# Patient Record
Sex: Female | Born: 1985
Health system: Southern US, Community
[De-identification: ages and names within clinical notes are randomized; demographics above are authoritative.]

## PROBLEM LIST (undated history)

## (undated) DIAGNOSIS — IMO0001 Reserved for inherently not codable concepts without codable children: Secondary | ICD-10-CM

## (undated) DIAGNOSIS — L509 Urticaria, unspecified: Secondary | ICD-10-CM

## (undated) HISTORY — DX: Urticaria, unspecified: L50.9

---
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
## 2011-01-14 ENCOUNTER — Inpatient Hospital Stay (HOSPITAL_COMMUNITY)
Admission: AD | Admit: 2011-01-14 | Discharge: 2011-01-15 | Disposition: A | Discharge status: Home / Self Care | Payer: BC Managed Care – PPO | Source: Ambulatory Visit | Attending: Obstetrics and Gynecology | Admitting: Obstetrics and Gynecology

## 2011-01-14 DIAGNOSIS — O479 False labor, unspecified: Secondary | ICD-10-CM | POA: Insufficient documentation

## 2011-01-17 ENCOUNTER — Inpatient Hospital Stay (HOSPITAL_COMMUNITY)
Admission: AD | Admit: 2011-01-17 | Discharge: 2011-01-18 | Disposition: A | Discharge status: Home / Self Care | Payer: BC Managed Care – PPO | Source: Ambulatory Visit | Attending: Obstetrics and Gynecology | Admitting: Obstetrics and Gynecology

## 2011-01-17 DIAGNOSIS — O479 False labor, unspecified: Secondary | ICD-10-CM | POA: Insufficient documentation

## 2011-01-23 ENCOUNTER — Inpatient Hospital Stay (HOSPITAL_COMMUNITY)
Admission: RE | Admit: 2011-01-23 | Discharge: 2011-01-25 | DRG: 373 | Disposition: A | Discharge status: Home / Self Care | Payer: BC Managed Care – PPO | Source: Ambulatory Visit | Attending: Obstetrics and Gynecology | Admitting: Obstetrics and Gynecology

## 2011-01-23 LAB — ABO/RH: ABO/RH(D): O POS

## 2011-01-23 LAB — CBC
HCT: 37.4 % (ref 36.0–46.0)
MCH: 30.3 pg (ref 26.0–34.0)
WBC: 15.3 10*3/uL — ABNORMAL HIGH (ref 4.0–10.5)

## 2011-01-24 LAB — CBC
HCT: 36.7 % (ref 36.0–46.0)
Platelets: 262 10*3/uL (ref 150–400)
RBC: 4.02 MIL/uL (ref 3.87–5.11)
WBC: 20.5 10*3/uL — ABNORMAL HIGH (ref 4.0–10.5)

## 2011-01-25 NOTE — Discharge Summary (Signed)
  Becky Leonard, Becky Leonard                ACCOUNT NO.:  [id number]  MEDICAL RECORD NO.:  [id number]           PATIENT TYPE:  I  LOCATION:  9135                          FACILITY:  [facility]  PHYSICIAN:  Sherron Monday, MD        DATE OF BIRTH:  1986-03-02  DATE OF ADMISSION:  01/23/2011 DATE OF DISCHARGE:  01/25/2011                              DISCHARGE SUMMARY   ADMITTING DIAGNOSIS:  Intrauterine pregnancy at 39 weeks for induction of labor.  DISCHARGE DIAGNOSES: 1. Intrauterine pregnancy at 39 weeks for induction of labor. 2. Delivered via spontaneous vaginal delivery.  HISTORY AND PHYSICAL:  For the complete history and physical, please see the dictated note.  HOSPITAL COURSE:  In brief, this 25 year old, G1, P0 at 16 weeks with favorable cervix for induction of labor.  She was admitted on the January 23, 2011 underwent induction without complication.  AROM was performed for light meconium.  This was discussed with the patient.  She was progressed to complete-complete +2, push for approximately an hour to deliver a viable female infant at 94 with Apgars of 8 at 1 minute and 9 at 5 minutes and weight of 7 pounds 1 ounce.  Placenta expressed intact. Left labial laceration and first-degree perineal laceration with 3-0 Vicryl Rapide repaired in the typical fashion.  Her postpartum course was relatively uncomplicated.  She remained afebrile and vital signs stable.  Hemoglobin decreased from 12.5-12.0.  She was discharged home on postpartum day #2, was ambulating, voiding without difficulty and normal lochia.  Her pain was well controlled.  She was discharged home with routine discharge instructions.  Numbers to call with any questions or problems.  She voices understanding.  She was also send with prescriptions for Motrin, Vicodin, and prenatal vitamins.  DISCHARGE INFORMATION:  O+, rubella immune, breast-feeding were discussed.  Contraception and 6-week checkup.  Hemoglobin shot from  5- 12.6.     Sherron Monday, MD     JB/MEDQ  D:  01/25/2011  T:  01/25/2011  Job:  [id number]  Electronically Signed by Sherron Monday MD on 01/25/2011 04:28:36 PM

## 2011-01-25 NOTE — H&P (Signed)
  NAMEAKERA, SNOWBERGER                ACCOUNT NO.:  [id number]  MEDICAL RECORD NO.:  [id number]         PATIENT TYPE:  WINP  LOCATION:  165                           [facility]ILITY:  [facility]  PHYSICIAN:  Sherron Monday, MD        DATE OF BIRTH:  02-Jul-1986  DATE OF ADMISSION:  01/23/2011 DATE OF DISCHARGE:                             HISTORY & PHYSICAL   ADMISSION DIAGNOSIS:  Intrauterine pregnancy at 39 plus weeks.  PROCEDURE:  Planned induction of labor.  HISTORY OF PRESENT ILLNESS:  A 25 year old G1, P0 at 35 weeks with an [facility] of January 26, 2011 by first trimester ultrasound who presents with favorable cervix and desires induction of labor.  She states she has had good fetal movement.  No loss of fluid.  No vaginal bleeding and irregular contractions.  We discussed with the patient risks, benefits and alternatives of induction of labor and she voices understanding to all of this.  PAST MEDICAL HISTORY:  Significant for her polycystic ovaries.  PAST SURGICAL HISTORY:  Not significant.  PAST OB/GYN HISTORY:  She is a G1 P0.  No abnormal Pap smear or sexually transmitted diseases.  MEDICATIONS:  Include prenatal vitamins.  ALLERGIES:  No known drug allergies.  No latex allergy.  SOCIAL HISTORY:  Denies alcohol, tobacco or drug use.  She is married.  FAMILY HISTORY:  Significant for diabetes and hypertension.  LABORATORY DATA:  She is O+.  Antibody screen negative.  Hemoglobin 12.5.  Pap is within normal limits.  Rubella immune.  RPR nonreactive. Hepatitis B surface antigen negative.  HIV negative.  Gonorrhea negative.  Chlamydia negative.  Platelets 301,000.  A 19-week ultrasound revealed confirmed EDC of January 26, 2011 revealed normal anatomy, anterior placenta, surprisingly tender.  Glucola was 107.  Group B strep is negative.  PHYSICAL EXAMINATION:  VITAL SIGNS:  She is afebrile.  Vital signs stable. GENERAL:  No apparent distress. CARDIOVASCULAR:  Regular rate and  rhythm. LUNGS:  Clear to auscultation bilaterally. ABDOMEN:  Soft.  Fundus is nontender. EXTREMITIES:  Symmetric and nontender. VAGINAL:  Her cervix was 3 cm dilated, 70% effaced, and -2 station. Fetal heart tones were in the 150s and this was made measuring appropriate for gestational age.  ASSESSMENT AND PLAN:  A 25 year old G1, P0 at 51 plus week for induction of labor given favorable cervix and term status.  She understands risks, benefits and alternatives of induction.  We advised that an induction along with Pitocin and with artificial rupture of membranes.  She voices understanding.  She will receive an epidural.     Sherron Monday, MD     JB/MEDQ  D:  01/22/2011  T:  01/22/2011  Job:  [id number]  Electronically Signed by Sherron Monday MD on 01/25/2011 04:28:03 PM

## 2011-01-26 ENCOUNTER — Inpatient Hospital Stay (HOSPITAL_COMMUNITY): Admission: AD | Admit: 2011-01-26 | Payer: Self-pay | Source: Home / Self Care | Admitting: Obstetrics and Gynecology

## 2011-12-01 ENCOUNTER — Inpatient Hospital Stay (HOSPITAL_COMMUNITY)
Admission: AD | Admit: 2011-12-01 | Payer: BC Managed Care – PPO | Source: Ambulatory Visit | Admitting: Obstetrics and Gynecology

## 2012-04-18 ENCOUNTER — Encounter (HOSPITAL_COMMUNITY): Payer: Self-pay | Admitting: Emergency Medicine

## 2012-04-18 ENCOUNTER — Emergency Department (HOSPITAL_COMMUNITY)
Admission: EM | Admit: 2012-04-18 | Discharge: 2012-04-18 | Disposition: A | Discharge status: Home / Self Care | Payer: BC Managed Care – PPO | Attending: Emergency Medicine | Admitting: Emergency Medicine

## 2012-04-18 DIAGNOSIS — S39012A Strain of muscle, fascia and tendon of lower back, initial encounter: Secondary | ICD-10-CM

## 2012-04-18 DIAGNOSIS — S335XXA Sprain of ligaments of lumbar spine, initial encounter: Secondary | ICD-10-CM | POA: Insufficient documentation

## 2012-04-18 DIAGNOSIS — Y998 Other external cause status: Secondary | ICD-10-CM | POA: Insufficient documentation

## 2012-04-18 DIAGNOSIS — Y93I9 Activity, other involving external motion: Secondary | ICD-10-CM | POA: Insufficient documentation

## 2012-04-18 DIAGNOSIS — S139XXA Sprain of joints and ligaments of unspecified parts of neck, initial encounter: Secondary | ICD-10-CM

## 2012-04-18 MED ORDER — CYCLOBENZAPRINE HCL 10 MG PO TABS: 10.0000 mg | ORAL_TABLET | Freq: Two times a day (BID) | ORAL | Status: AC | PRN
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
## 2012-04-18 NOTE — ED Provider Notes (Signed)
Medical screening examination/treatment/procedure(s) were performed by non-physician practitioner and as supervising physician I was immediately available for consultation/collaboration.   Zarayah Lanting A Aryon Nham, MD 04/18/12 1618 
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
## 2012-05-16 LAB — OB RESULTS CONSOLE RPR
RPR: NONREACTIVE
RPR: NONREACTIVE
RPR: NONREACTIVE

## 2012-05-16 LAB — OB RESULTS CONSOLE ABO/RH

## 2012-05-16 LAB — OB RESULTS CONSOLE HEPATITIS B SURFACE ANTIGEN: Hepatitis B Surface Ag: NEGATIVE

## 2012-05-16 LAB — OB RESULTS CONSOLE ANTIBODY SCREEN: Antibody Screen: NEGATIVE

## 2012-10-30 NOTE — L&D Delivery Note (Signed)
Delivery Note At 3:18 AM a viable and healthy female was delivered via Vaginal, Spontaneous Delivery (Presentation: ; Occiput Anterior; LOT).  APGAR: 8, 9; weight P .   Placenta status: Intact, Spontaneous.  Cord: 3 vessels with the following complications: None.    Anesthesia: Epidural  Episiotomy: None Lacerations: None Suture Repair: N/A Est. Blood Loss (mL): 400  Mom to postpartum.  Baby to stay with mommy and daddy.  BOVARD,Brionna Romanek 11/28/2012, 3:40 AM  Br/O+/RI/ Sunnie Nielsen? D/w pt and FOB circumcision for female infant inc r/b/a, wish to proceed

## 2012-11-07 LAB — OB RESULTS CONSOLE GBS: GBS: NEGATIVE

## 2012-11-27 ENCOUNTER — Inpatient Hospital Stay (HOSPITAL_COMMUNITY)
Admission: AD | Admit: 2012-11-27 | Discharge: 2012-11-29 | DRG: 373 | Disposition: A | Discharge status: Home / Self Care | Payer: BC Managed Care – PPO | Source: Ambulatory Visit | Attending: Obstetrics and Gynecology | Admitting: Obstetrics and Gynecology

## 2012-11-27 ENCOUNTER — Encounter (HOSPITAL_COMMUNITY): Payer: Self-pay | Admitting: *Deleted

## 2012-11-27 DIAGNOSIS — IMO0001 Reserved for inherently not codable concepts without codable children: Secondary | ICD-10-CM

## 2012-11-27 HISTORY — DX: Reserved for inherently not codable concepts without codable children: IMO0001

## 2012-11-27 MED ORDER — ACETAMINOPHEN 325 MG PO TABS: 650.0000 mg | ORAL_TABLET | ORAL | Status: DC | PRN

## 2012-11-27 MED ORDER — PHENYLEPHRINE 40 MCG/ML (10ML) SYRINGE FOR IV PUSH (FOR BLOOD PRESSURE SUPPORT): 80.0000 ug | PREFILLED_SYRINGE | INTRAVENOUS | Status: DC | PRN

## 2012-11-27 MED ORDER — LACTATED RINGERS IV SOLN
INTRAVENOUS | Status: DC
  Administered 2012-11-28: 03:00:00 via INTRAVENOUS

## 2012-11-27 MED ORDER — ONDANSETRON HCL 4 MG/2ML IJ SOLN: 4.0000 mg | Freq: Four times a day (QID) | INTRAMUSCULAR | Status: DC | PRN

## 2012-11-27 MED ORDER — CITRIC ACID-SODIUM CITRATE 334-500 MG/5ML PO SOLN: 30.0000 mL | ORAL | Status: DC | PRN

## 2012-11-27 MED ORDER — LACTATED RINGERS IV SOLN
500.0000 mL | Freq: Once | INTRAVENOUS | Status: AC
  Administered 2012-11-27: 500 mL via INTRAVENOUS

## 2012-11-27 MED ORDER — PHENYLEPHRINE 40 MCG/ML (10ML) SYRINGE FOR IV PUSH (FOR BLOOD PRESSURE SUPPORT)
80.0000 ug | PREFILLED_SYRINGE | INTRAVENOUS | Status: DC | PRN
  Filled 2012-11-27: qty 5

## 2012-11-27 MED ORDER — EPHEDRINE 5 MG/ML INJ: 10.0000 mg | INTRAVENOUS | Status: DC | PRN

## 2012-11-27 MED ORDER — LACTATED RINGERS IV SOLN: 500.0000 mL | INTRAVENOUS | Status: DC | PRN

## 2012-11-27 MED ORDER — LIDOCAINE HCL (PF) 1 % IJ SOLN
30.0000 mL | INTRAMUSCULAR | Status: DC | PRN
  Filled 2012-11-27: qty 30

## 2012-11-27 MED ORDER — OXYTOCIN 40 UNITS IN LACTATED RINGERS INFUSION - SIMPLE MED
62.5000 mL/h | INTRAVENOUS | Status: DC
  Filled 2012-11-27: qty 1000

## 2012-11-27 MED ORDER — FLEET ENEMA 7-19 GM/118ML RE ENEM: 1.0000 | ENEMA | RECTAL | Status: DC | PRN

## 2012-11-27 MED ORDER — BUTORPHANOL TARTRATE 2 MG/ML IJ SOLN: 2.0000 mg | INTRAMUSCULAR | Status: DC | PRN

## 2012-11-27 MED ORDER — OXYCODONE-ACETAMINOPHEN 5-325 MG PO TABS: 1.0000 | ORAL_TABLET | ORAL | Status: DC | PRN

## 2012-11-27 MED ORDER — DIPHENHYDRAMINE HCL 50 MG/ML IJ SOLN: 12.5000 mg | INTRAMUSCULAR | Status: DC | PRN

## 2012-11-27 MED ORDER — IBUPROFEN 600 MG PO TABS
600.0000 mg | ORAL_TABLET | Freq: Four times a day (QID) | ORAL | Status: DC | PRN
  Administered 2012-11-28: 600 mg via ORAL
  Filled 2012-11-27: qty 1

## 2012-11-27 MED ORDER — FENTANYL 2.5 MCG/ML BUPIVACAINE 1/10 % EPIDURAL INFUSION (WH - ANES)
14.0000 mL/h | INTRAMUSCULAR | Status: DC
  Filled 2012-11-27: qty 125

## 2012-11-27 MED ORDER — EPHEDRINE 5 MG/ML INJ
10.0000 mg | INTRAVENOUS | Status: DC | PRN
  Filled 2012-11-27: qty 4

## 2012-11-27 MED ORDER — OXYTOCIN BOLUS FROM INFUSION: 500.0000 mL | INTRAVENOUS | Status: DC

## 2012-11-27 NOTE — MAU Note (Signed)
Pt states her contractions increased in intensity at 1730. Pt states she had a visit in the office at 900am pt states she was 3cm

## 2012-11-28 ENCOUNTER — Encounter (HOSPITAL_COMMUNITY): Payer: Self-pay

## 2012-11-28 ENCOUNTER — Inpatient Hospital Stay (HOSPITAL_COMMUNITY): Payer: BC Managed Care – PPO | Admitting: Anesthesiology

## 2012-11-28 ENCOUNTER — Encounter (HOSPITAL_COMMUNITY): Payer: Self-pay | Admitting: Anesthesiology

## 2012-11-28 DIAGNOSIS — IMO0001 Reserved for inherently not codable concepts without codable children: Secondary | ICD-10-CM

## 2012-11-28 HISTORY — DX: Reserved for inherently not codable concepts without codable children: IMO0001

## 2012-11-28 LAB — CBC
MCH: 31.3 pg (ref 26.0–34.0)
MCV: 91.8 fL (ref 78.0–100.0)
Platelets: 247 10*3/uL (ref 150–400)
RDW: 12.7 % (ref 11.5–15.5)

## 2012-11-28 LAB — SYPHILIS: RPR W/REFLEX TO RPR TITER AND TREPONEMAL ANTIBODIES, TRADITIONAL SCREENING AND DIAGNOSIS ALGORITHM: RPR Ser Ql: NONREACTIVE

## 2012-11-28 MED ORDER — SENNOSIDES-DOCUSATE SODIUM 8.6-50 MG PO TABS
2.0000 | ORAL_TABLET | Freq: Every day | ORAL | Status: DC
  Administered 2012-11-28: 2 via ORAL

## 2012-11-28 MED ORDER — BENZOCAINE-MENTHOL 20-0.5 % EX AERO: 1.0000 "application " | INHALATION_SPRAY | CUTANEOUS | Status: DC | PRN

## 2012-11-28 MED ORDER — DIPHENHYDRAMINE HCL 25 MG PO CAPS: 25.0000 mg | ORAL_CAPSULE | Freq: Four times a day (QID) | ORAL | Status: DC | PRN

## 2012-11-28 MED ORDER — IBUPROFEN 600 MG PO TABS
600.0000 mg | ORAL_TABLET | Freq: Four times a day (QID) | ORAL | Status: DC
  Administered 2012-11-28 – 2012-11-29 (×4): 600 mg via ORAL
  Filled 2012-11-28 (×4): qty 1

## 2012-11-28 MED ORDER — ONDANSETRON HCL 4 MG/2ML IJ SOLN: 4.0000 mg | INTRAMUSCULAR | Status: DC | PRN

## 2012-11-28 MED ORDER — WITCH HAZEL-GLYCERIN EX PADS: 1.0000 "application " | MEDICATED_PAD | CUTANEOUS | Status: DC | PRN

## 2012-11-28 MED ORDER — FENTANYL 2.5 MCG/ML BUPIVACAINE 1/10 % EPIDURAL INFUSION (WH - ANES)
INTRAMUSCULAR | Status: DC | PRN
  Administered 2012-11-28: 14 mL/h via EPIDURAL

## 2012-11-28 MED ORDER — ONDANSETRON HCL 4 MG PO TABS: 4.0000 mg | ORAL_TABLET | ORAL | Status: DC | PRN

## 2012-11-28 MED ORDER — ZOLPIDEM TARTRATE 5 MG PO TABS: 5.0000 mg | ORAL_TABLET | Freq: Every evening | ORAL | Status: DC | PRN

## 2012-11-28 MED ORDER — OXYCODONE-ACETAMINOPHEN 5-325 MG PO TABS: 1.0000 | ORAL_TABLET | ORAL | Status: DC | PRN

## 2012-11-28 MED ORDER — PRENATAL MULTIVITAMIN CH
1.0000 | ORAL_TABLET | Freq: Every day | ORAL | Status: DC
  Administered 2012-11-28 – 2012-11-29 (×2): 1 via ORAL
  Filled 2012-11-28 (×2): qty 1

## 2012-11-28 MED ORDER — DIBUCAINE 1 % RE OINT: 1.0000 "application " | TOPICAL_OINTMENT | RECTAL | Status: DC | PRN

## 2012-11-28 MED ORDER — LACTATED RINGERS IV SOLN: INTRAVENOUS | Status: DC

## 2012-11-28 MED ORDER — LIDOCAINE HCL (PF) 1 % IJ SOLN
INTRAMUSCULAR | Status: DC | PRN
  Administered 2012-11-28 (×2): 5 mL

## 2012-11-28 MED ORDER — LANOLIN HYDROUS EX OINT: TOPICAL_OINTMENT | CUTANEOUS | Status: DC | PRN

## 2012-11-28 MED ORDER — SIMETHICONE 80 MG PO CHEW: 80.0000 mg | CHEWABLE_TABLET | ORAL | Status: DC | PRN

## 2012-11-28 NOTE — Progress Notes (Signed)
Patient ID: Becky Leonard, female   DOB: 06-12-1986, 27 y.o.   MRN: [id number]  Pt comfortable with epidural, some pressure  AFVSS gen NAD FHTs 140-150's mod var toco q 2-8min  SVE 10/100/+2, per RN  Will start pushing, anticipate SVD

## 2012-11-28 NOTE — Progress Notes (Signed)
Post Partum Day 0 Subjective: no complaints, up ad lib, voiding, tolerating PO and nl lochia, pain controlled  Objective: Blood pressure 95/56, pulse 96, temperature 99.2 F (37.3 C), temperature source Oral, resp. rate 18, height 5\' 1"  (1.549 m), weight 72.576 kg (160 lb), last menstrual period 03/30/2012, SpO2 97.00%, unknown if currently breastfeeding.  Physical Exam:  General: alert and no distress Lochia: appropriate Uterine Fundus: firm   Basename 11/27/12 2350  HGB 12.6  HCT 37.0    Assessment/Plan: Plan for discharge tomorrow or Saturday, Breastfeeding and Lactation consult.  Doing well.  Routine care.     LOS: 1 day   BOVARD,Estera Ozier 11/28/2012, 7:06 AM

## 2012-11-28 NOTE — H&P (Signed)
Becky Leonard is a 27 y.o. female G2P1001 at 38+ in active labor.  +FM, no LOF, no VB, ctx increasing in intensity and frequency, uncomplicated PNC. Maternal Medical History:  Reason for admission: Reason for admission: contractions.  Contractions: Onset was 3-5 hours ago.   Frequency: regular.    Fetal activity: Perceived fetal activity is normal.      OB History    Grav Para Term Preterm Abortions TAB SAB Ect Mult Living   2 1 1       1     G1 3/12 SVD 7#1 female; G2 present; no abn pap, no STDs. PCOS Past Medical History  Diagnosis Date  . Active labor at term 11/28/2012   History reviewed. No pertinent past surgical history. Family History: family history includes Diabetes in her other and Hypertension in her father.Afib, blood clot, CAD Social History:  reports that she has never smoked. She has never used smokeless tobacco. She reports that she does not drink alcohol or use illicit drugs.married Meds PNV All NKDA   Prenatal Transfer Tool  Maternal Diabetes: No Genetic Screening: Declined Maternal Ultrasounds/Referrals: Normal Fetal Ultrasounds or other Referrals:  None Maternal Substance Abuse:  No Significant Maternal Medications:  None Significant Maternal Lab Results:  Lab values include: Group B Strep negative Other Comments:  None  Review of Systems  Constitutional: Negative.   HENT: Negative.   Eyes: Negative.   Respiratory: Negative.   Cardiovascular: Negative.   Gastrointestinal: Negative.   Genitourinary: Negative.   Musculoskeletal: Negative.   Skin: Negative.   Neurological: Negative.   Psychiatric/Behavioral: Negative.     Dilation: 7.5 Effacement (%): 80 Station: -2 Exam by:: B.Bethea RN Blood pressure 144/82, pulse 101, temperature 97.9 F (36.6 C), temperature source Oral, resp. rate 20, height 5\' 1"  (1.549 m), weight 72.576 kg (160 lb), last menstrual period 03/30/2012. Maternal Exam:  Uterine Assessment: Contraction strength is moderate.   Contraction frequency is regular.   Abdomen: Fundal height is appropriate for gestation.   Estimated fetal weight is 7#.   Fetal presentation: vertex  Introitus: Normal vulva. Normal vagina.  Cervix: Cervix evaluated by digital exam.     Physical Exam  Constitutional: She is oriented to person, place, and time. She appears well-developed and well-nourished.  HENT:  Head: Normocephalic and atraumatic.  Neck: Normal range of motion. Neck supple.  Cardiovascular: Normal rate and regular rhythm.   Respiratory: Effort normal and breath sounds normal. No respiratory distress.  GI: Soft. Bowel sounds are normal. There is no tenderness.  Musculoskeletal: Normal range of motion.  Neurological: She is alert and oriented to person, place, and time.  Skin: Skin is warm and dry.  Psychiatric: She has a normal mood and affect. Her behavior is normal.    Prenatal labs: ABO, Rh: O/--/-- (07/18 0000) Antibody: Negative (07/18 0000) Rubella: Immune (07/18 0000) RPR: Nonreactive, Nonreactive, Nonreactive (07/18 0000)  HBsAg: Negative (07/18 0000)  HIV: Non-reactive, Non-reactive, Non-reactive (07/18 0000)  GBS: Negative (01/09 0000)  Hgb 13.0/ Pap WNL/ Plt 278K/ GC neg/ Chl neg/ screeening declined/ glucola 113  Tdap/Flu 09/12/12  [place] EDC 2/9 cwd, first tri [place] nl ant, ant plac, female  Assessment/Plan: 26yo G2P1001 @ 38+ in active labor Epidural for comfort gbbs neg Expect SVD   BOVARD,Deliana Avalos 11/28/2012, 12:31 AM

## 2012-11-28 NOTE — Anesthesia Preprocedure Evaluation (Signed)
Anesthesia Evaluation  Patient identified by MRN, date of birth, ID band Patient awake    Reviewed: Allergy & Precautions, H&P , NPO status , Patient's Chart, lab work & pertinent test results  Airway Mallampati: II TM Distance: >3 FB Neck ROM: full    Dental No notable dental hx.    Pulmonary neg pulmonary ROS,  breath sounds clear to auscultation  Pulmonary exam normal       Cardiovascular Exercise Tolerance: Good negative cardio ROS  Rhythm:regular Rate:Normal     Neuro/Psych negative neurological ROS  negative psych ROS   GI/Hepatic negative GI ROS, Neg liver ROS,   Endo/Other  negative endocrine ROS  Renal/GU negative Renal ROS  negative genitourinary   Musculoskeletal   Abdominal   Peds  Hematology negative hematology ROS (+)   Anesthesia Other Findings   Reproductive/Obstetrics negative OB ROS (+) Pregnancy                           Anesthesia Physical Anesthesia Plan  ASA: II  Anesthesia Plan: Epidural   Post-op Pain Management:    Induction:   Airway Management Planned:   Additional Equipment:   Intra-op Plan:   Post-operative Plan:   Informed Consent: I have reviewed the patients History and Physical, chart, labs and discussed the procedure including the risks, benefits and alternatives for the proposed anesthesia with the patient or authorized representative who has indicated his/her understanding and acceptance.   Dental Advisory Given  Plan Discussed with:   Anesthesia Plan Comments:         Anesthesia Quick Evaluation

## 2012-11-28 NOTE — Anesthesia Procedure Notes (Signed)
Epidural Patient location during procedure: OB  Staffing Anesthesiologist: Dalton Molesworth Performed by: anesthesiologist   Preanesthetic Checklist Completed: patient identified, site marked, surgical consent, pre-op evaluation, timeout performed, IV checked, risks and benefits discussed and monitors and equipment checked  Epidural Patient position: sitting Prep: ChloraPrep Patient monitoring: heart rate, continuous pulse ox and blood pressure Approach: midline Injection technique: LOR saline  Needle:  Needle type: Tuohy  Needle gauge: 17 G Needle length: 9 cm and 9 Needle insertion depth: 6 cm Catheter type: closed end flexible Catheter size: 20 Guage Catheter at skin depth: 12 cm Test dose: negative  Assessment Events: blood not aspirated, injection not painful, no injection resistance, negative IV test and no paresthesia  Additional Notes   Patient tolerated the insertion well without complications.   

## 2012-11-28 NOTE — Progress Notes (Signed)
Patient ID: Becky Leonard, female   DOB: Nov 10, 1985, 27 y.o.   MRN: [id number]  Comfortable with epidural  AFVSS gen NAD FHTs 150's, mod var toco q 2-4  SVE 9.5/100/0-+1  AROM for clear fluid w/o diff/comp  26yo G2P1001 at 38+ in active labor, anticipate SVD,

## 2012-11-29 LAB — CBC
HCT: 34.6 % — ABNORMAL LOW (ref 36.0–46.0)
Hemoglobin: 11.6 g/dL — ABNORMAL LOW (ref 12.0–15.0)
MCH: 31.4 pg (ref 26.0–34.0)
MCHC: 33.5 g/dL (ref 30.0–36.0)
MCV: 93.8 fL (ref 78.0–100.0)
Platelets: 208 K/uL (ref 150–400)
RBC: 3.69 MIL/uL — ABNORMAL LOW (ref 3.87–5.11)
RDW: 12.9 % (ref 11.5–15.5)
WBC: 15.2 K/uL — ABNORMAL HIGH (ref 4.0–10.5)

## 2012-11-29 MED ORDER — OXYCODONE-ACETAMINOPHEN 5-325 MG PO TABS: 1.0000 | ORAL_TABLET | ORAL | Status: DC | PRN

## 2012-11-29 MED ORDER — PRENATAL MULTIVITAMIN CH: 1.0000 | ORAL_TABLET | Freq: Every day | ORAL | Status: DC

## 2012-11-29 MED ORDER — IBUPROFEN 800 MG PO TABS: 800.0000 mg | ORAL_TABLET | Freq: Three times a day (TID) | ORAL | Status: DC | PRN

## 2012-11-29 NOTE — Progress Notes (Addendum)
Post Partum Day 1 Subjective: no complaints, up ad lib, voiding, tolerating PO and nl lochia, pain controlled  Objective: Blood pressure 103/67, pulse 61, temperature 97.9 F (36.6 C), temperature source Oral, resp. rate 18, height 5\' 1"  (1.549 m), weight 72.576 kg (160 lb), last menstrual period 03/30/2012, SpO2 95.00%, unknown if currently breastfeeding.  Physical Exam:  General: alert and no distress Lochia: appropriate Uterine Fundus: firm    Basename 11/29/12 0505 11/27/12 2350  HGB 11.6* 12.6  HCT 34.6* 37.0    Assessment/Plan: Discharge home today or tomorrow, Breastfeeding and Lactation consult.  Routine care.  Pt desires d/c today if possible.  D/C with motrin, percocet, pnv; f/u 6 weeks   LOS: 2 days   BOVARD,Kila Godina 11/29/2012, 8:33 AM

## 2012-11-29 NOTE — Anesthesia Postprocedure Evaluation (Signed)
Anesthesia Post Note  Patient: Becky Leonard  Procedure(s) Performed: * No procedures listed *  Anesthesia type: Epidural  Patient location: Mother/Baby  Post pain: Pain level controlled  Post assessment: Post-op Vital signs reviewed  Last Vitals:  Filed Vitals:   11/29/12 0532  BP: 103/67  Pulse: 61  Temp: 36.6 C  Resp: 18    Post vital signs: Reviewed  Level of consciousness:alert  Complications: No apparent anesthesia complications

## 2012-11-29 NOTE — Discharge Summary (Signed)
Obstetric Discharge Summary Reason for Admission: onset of labor Prenatal Procedures: none Intrapartum Procedures: spontaneous vaginal delivery Postpartum Procedures: none Complications-Operative and Postpartum: none Hemoglobin  Date Value Range Status  11/29/2012 11.6* 12.0 - 15.0 g/dL Final     HCT  Date Value Range Status  11/29/2012 34.6* 36.0 - 46.0 % Final    Physical Exam:  General: alert and no distress Lochia: appropriate Uterine Fundus: firm  Discharge Diagnoses: Term Pregnancy-delivered  Discharge Information: Date: 11/29/2012 Activity: pelvic rest Diet: routine Medications: PNV, Ibuprofen and Percocet Condition: stable Instructions: refer to practice specific booklet Discharge to: home Follow-up Information    Follow up with BOVARD,Sirenia Whitis, MD. Schedule an appointment as soon as possible for a visit in 6 weeks.   Contact information:   [address] [place] [address] [phone]          Newborn Data: Live born female  Birth Weight: 7 lb 13.8 oz (3566 g) APGAR: 8, 9  Home with mother.  BOVARD,Marciano Mundt 11/29/2012, 8:55 AM

## 2013-09-03 ENCOUNTER — Encounter (HOSPITAL_COMMUNITY): Payer: Self-pay | Admitting: Emergency Medicine

## 2013-09-03 ENCOUNTER — Emergency Department (HOSPITAL_COMMUNITY): Payer: BC Managed Care – PPO

## 2013-09-03 ENCOUNTER — Emergency Department (HOSPITAL_COMMUNITY)
Admission: EM | Admit: 2013-09-03 | Discharge: 2013-09-03 | Disposition: A | Discharge status: Home / Self Care | Payer: BC Managed Care – PPO | Attending: Emergency Medicine | Admitting: Emergency Medicine

## 2013-09-03 DIAGNOSIS — R109 Unspecified abdominal pain: Secondary | ICD-10-CM

## 2013-09-03 DIAGNOSIS — R110 Nausea: Secondary | ICD-10-CM | POA: Insufficient documentation

## 2013-09-03 DIAGNOSIS — Z3202 Encounter for pregnancy test, result negative: Secondary | ICD-10-CM | POA: Insufficient documentation

## 2013-09-03 LAB — URINALYSIS, ROUTINE W REFLEX MICROSCOPIC
Bilirubin Urine: NEGATIVE
Nitrite: NEGATIVE
Specific Gravity, Urine: 1.019 (ref 1.005–1.030)
pH: 6 (ref 5.0–8.0)

## 2013-09-03 LAB — CBC WITH DIFFERENTIAL/PLATELET
Basophils Absolute: 0.1 10*3/uL (ref 0.0–0.1)
Basophils Relative: 1 % (ref 0–1)
Eosinophils Absolute: 0.1 10*3/uL (ref 0.0–0.7)
MCH: 31.9 pg (ref 26.0–34.0)
MCHC: 34.5 g/dL (ref 30.0–36.0)
Neutrophils Relative %: 66 % (ref 43–77)
Platelets: 296 10*3/uL (ref 150–400)
RBC: 4.29 MIL/uL (ref 3.87–5.11)
RDW: 12.2 % (ref 11.5–15.5)

## 2013-09-03 LAB — URINE MICROSCOPIC-ADD ON

## 2013-09-03 LAB — COMPREHENSIVE METABOLIC PANEL
ALT: 9 U/L (ref 0–35)
Albumin: 4.9 g/dL (ref 3.5–5.2)
Alkaline Phosphatase: 52 U/L (ref 39–117)
Potassium: 3.7 mEq/L (ref 3.5–5.1)
Sodium: 138 mEq/L (ref 135–145)
Total Protein: 8.4 g/dL — ABNORMAL HIGH (ref 6.0–8.3)

## 2013-09-03 LAB — POCT PREGNANCY, URINE: Preg Test, Ur: NEGATIVE

## 2013-09-03 NOTE — ED Notes (Signed)
Pt c/o nausea since yesterday and intermittent right side pain that started today. Denies vomiting and diarrhea. Nad, skin warm and dry, resp e/u.

## 2013-09-03 NOTE — ED Provider Notes (Signed)
CSN: [id number]     Arrival date & time 09/03/13  1428 History   First MD Initiated Contact with Patient 09/03/13 1632     Chief Complaint  Patient presents with  . Abdominal Pain   (Consider location/radiation/quality/duration/timing/severity/associated sxs/prior Treatment) HPI Comments: Patient presents emergency department with chief complaint of abdominal pain. She states the pain started this morning. Started his left lower quadrant pain, and then moved to the right lower quadrant. She states that now it is moving to the right upper quadrant. She endorses associated nausea. She denies fevers, chills or, vomiting, diarrhea, constipation, dysuria, or vaginal discharge. She reports moderate to severe pain. The pain is worsened when she is up and moving.  The history is provided by the patient. No language interpreter was used.    Past Medical History  Diagnosis Date  . Active labor at term 11/28/2012  . SVD (spontaneous vaginal delivery) 11/28/2012   History reviewed. No pertinent past surgical history. Family History  Problem Relation Age of Onset  . Hypertension Father   . Diabetes Other    History  Substance Use Topics  . Smoking status: Never Smoker   . Smokeless tobacco: Never Used  . Alcohol Use: No   OB History   Grav Para Term Preterm Abortions TAB SAB Ect Mult Living   2 2 2       2      Review of Systems  All other systems reviewed and are negative.    Allergies  Review of patient's allergies indicates no known allergies.  Home Medications   Current Outpatient Rx  Name  Route  Sig  Dispense  Refill  . ibuprofen (ADVIL,MOTRIN) 800 MG tablet   Oral   Take 1 tablet (800 mg total) by mouth every 8 (eight) hours as needed for pain.   45 tablet   1   . oxyCODONE-acetaminophen (PERCOCET/ROXICET) 5-325 MG per tablet   Oral   Take 1-2 tablets by mouth every 4 (four) hours as needed (moderate - severe pain).   15 tablet   0   . Prenatal Vit-Fe Fumarate-FA  (PRENATAL MULTIVITAMIN) TABS   Oral   Take 1 tablet by mouth daily.         . Prenatal Vit-Fe Fumarate-FA (PRENATAL MULTIVITAMIN) TABS   Oral   Take 1 tablet by mouth daily.   30 tablet   12    BP 141/80  Pulse 108  Temp(Src) 98.3 F (36.8 C)  Resp 18  Ht 5\' 1"  (1.549 m)  Wt 119 lb (53.978 kg)  BMI 22.50 kg/m2  SpO2 99% Physical Exam  Nursing note and vitals reviewed. Constitutional: She is oriented to person, place, and time. She appears well-developed and well-nourished.  HENT:  Head: Normocephalic and atraumatic.  Eyes: Conjunctivae and EOM are normal. Pupils are equal, round, and reactive to light.  Neck: Normal range of motion. Neck supple.  Cardiovascular: Normal rate and regular rhythm.  Exam reveals no gallop and no friction rub.   No murmur heard. Pulmonary/Chest: Effort normal and breath sounds normal. No respiratory distress. She has no wheezes. She has no rales. She exhibits no tenderness.  Abdominal: Soft. Bowel sounds are normal. She exhibits no distension and no mass. There is no tenderness. There is no rebound and no guarding.  No focal abdominal tenderness, no rebounding, no pain at McBurney's point, no fluid wave, or signs of peritonitis  Musculoskeletal: Normal range of motion. She exhibits no edema and no tenderness.  Neurological: She is  alert and oriented to person, place, and time.  Skin: Skin is warm and dry.  Psychiatric: She has a normal mood and affect. Her behavior is normal. Judgment and thought content normal.    ED Course  Procedures (including critical care time) Results for orders placed during the hospital encounter of 09/03/13  CBC WITH DIFFERENTIAL      Result Value Range   WBC 8.8  4.0 - 10.5 K/uL   RBC 4.29  3.87 - 5.11 MIL/uL   Hemoglobin 13.7  12.0 - 15.0 g/dL   HCT 09.6  04.5 - 40.9 %   MCV 92.5  78.0 - 100.0 fL   MCH 31.9  26.0 - 34.0 pg   MCHC 34.5  30.0 - 36.0 g/dL   RDW 81.1  91.4 - 78.2 %   Platelets 296  150 - 400  K/uL   Neutrophils Relative % 66  43 - 77 %   Neutro Abs 5.8  1.7 - 7.7 K/uL   Lymphocytes Relative 26  12 - 46 %   Lymphs Abs 2.3  0.7 - 4.0 K/uL   Monocytes Relative 7  3 - 12 %   Monocytes Absolute 0.6  0.1 - 1.0 K/uL   Eosinophils Relative 1  0 - 5 %   Eosinophils Absolute 0.1  0.0 - 0.7 K/uL   Basophils Relative 1  0 - 1 %   Basophils Absolute 0.1  0.0 - 0.1 K/uL  COMPREHENSIVE METABOLIC PANEL      Result Value Range   Sodium 138  135 - 145 mEq/L   Potassium 3.7  3.5 - 5.1 mEq/L   Chloride 100  96 - 112 mEq/L   CO2 24  19 - 32 mEq/L   Glucose, Bld 79  70 - 99 mg/dL   BUN 11  6 - 23 mg/dL   Creatinine, Ser 9.56  0.50 - 1.10 mg/dL   Calcium 9.7  8.4 - 21.3 mg/dL   Total Protein 8.4 (*) 6.0 - 8.3 g/dL   Albumin 4.9  3.5 - 5.2 g/dL   AST 16  0 - 37 U/L   ALT 9  0 - 35 U/L   Alkaline Phosphatase 52  39 - 117 U/L   Total Bilirubin 0.5  0.3 - 1.2 mg/dL   GFR calc non Af Amer >90  >90 mL/min   GFR calc Af Amer >90  >90 mL/min  LIPASE, BLOOD      Result Value Range   Lipase 32  11 - 59 U/L  URINALYSIS, ROUTINE W REFLEX MICROSCOPIC      Result Value Range   Color, Urine YELLOW  YELLOW   APPearance CLEAR  CLEAR   Specific Gravity, Urine 1.019  1.005 - 1.030   pH 6.0  5.0 - 8.0   Glucose, UA NEGATIVE  NEGATIVE mg/dL   Hgb urine dipstick SMALL (*) NEGATIVE   Bilirubin Urine NEGATIVE  NEGATIVE   Ketones, ur 15 (*) NEGATIVE mg/dL   Protein, ur NEGATIVE  NEGATIVE mg/dL   Urobilinogen, UA 0.2  0.0 - 1.0 mg/dL   Nitrite NEGATIVE  NEGATIVE   Leukocytes, UA SMALL (*) NEGATIVE  URINE MICROSCOPIC-ADD ON      Result Value Range   Squamous Epithelial / LPF RARE  RARE   WBC, UA 0-2  <3 WBC/hpf   RBC / HPF 0-2  <3 RBC/hpf   Bacteria, UA MANY (*) RARE   Urine-Other MUCOUS PRESENT    POCT PREGNANCY, URINE  Result Value Range   Preg Test, Ur NEGATIVE  NEGATIVE   US Abdomen Complete  09/03/2013   CLINICAL DATA:  Abdominal pain  EXAM: ULTRASOUND ABDOMEN COMPLETE  COMPARISON:   None.  FINDINGS: Gallbladder  No gallstones or wall thickening visualized. No sonographic Murphy sign noted.  Common bile duct  Diameter: 2.9 mm.  Liver  No focal lesion identified. Within normal limits in parenchymal echogenicity.  IVC  No abnormality visualized.  Pancreas  Visualized portion unremarkable.  Spleen  Size and appearance within normal limits.  Right Kidney  Length: 12.2 cm. Echogenicity within normal limits. No mass or hydronephrosis visualized.  Left Kidney  Length: 12.1 cm. Echogenicity within normal limits. No mass or hydronephrosis visualized.  Abdominal aorta  No aneurysm visualized.  IMPRESSION: No acute finding by ultrasound. Negative exam.   Electronically Signed   By: Ruel Favors M.D.   On: 09/03/2013 18:23     EKG Interpretation   None       MDM   1. Abdominal  pain, other specified site     Patient with abdominal pain. The pain is moving. She endorses associated nausea. No fevers. No vomiting. Labs are reassuring.  UA shows some HGB will order abdominal/renal US.  Will re-evaluate.    Patient does not want any pain medicine at this time because she is nursing.  7:34 PM Ultrasound is normal. Other labs are reassuring. No white count, no fever. Abdomen is benign. The pain is nonfocal, and is moving. No tenderness with abdominal exam. Watch and wait at this time. Will discharge to home. Return precautions given. Patient will return if the pain localizes, or if she runs a fever, or have vomiting. Patient discussed with Dr. Effie Shy, who agrees with plan.    Roxy Horseman, PA-C 09/03/13 1935  Roxy Horseman, PA-C 09/03/13 [phone]

## 2013-09-03 NOTE — ED Notes (Signed)
Per pt right side pain that started this am. sts started as LLQ pain and radiated to RLQ and now is in right side area. sts 2 days of nausea.

## 2013-09-04 NOTE — ED Provider Notes (Signed)
Medical screening examination/treatment/procedure(s) were performed by non-physician practitioner and as supervising physician I was immediately available for consultation/collaboration.  Salimatou Simone L Aroldo Galli, MD 09/04/13 0023 

## 2014-01-08 ENCOUNTER — Other Ambulatory Visit: Payer: Self-pay | Admitting: Physician Assistant

## 2014-01-08 ENCOUNTER — Other Ambulatory Visit (HOSPITAL_COMMUNITY)
Admission: RE | Admit: 2014-01-08 | Discharge: 2014-01-08 | Disposition: A | Discharge status: Home / Self Care | Payer: BC Managed Care – PPO | Source: Ambulatory Visit | Attending: Physician Assistant | Admitting: Physician Assistant

## 2014-01-08 DIAGNOSIS — Z1151 Encounter for screening for human papillomavirus (HPV): Secondary | ICD-10-CM | POA: Insufficient documentation

## 2014-01-08 DIAGNOSIS — R87810 Cervical high risk human papillomavirus (HPV) DNA test positive: Secondary | ICD-10-CM | POA: Insufficient documentation

## 2014-01-08 DIAGNOSIS — Z124 Encounter for screening for malignant neoplasm of cervix: Secondary | ICD-10-CM | POA: Insufficient documentation

## 2014-02-05 ENCOUNTER — Other Ambulatory Visit: Payer: Self-pay | Admitting: Obstetrics & Gynecology

## 2014-03-05 ENCOUNTER — Other Ambulatory Visit: Payer: Self-pay | Admitting: Obstetrics & Gynecology

## 2014-08-31 ENCOUNTER — Encounter (HOSPITAL_COMMUNITY): Payer: Self-pay | Admitting: Emergency Medicine

## 2014-12-07 IMAGING — US US ABDOMEN COMPLETE
1 series · 14 of 25 positions shown · non-contrast
Comparison: None.

CLINICAL DATA: Abdominal pain

EXAM:
ULTRASOUND ABDOMEN COMPLETE

[Series 1: us abdomen complete · 0.21mm/px · 14 of 47 slices shown]
[im 1/47]
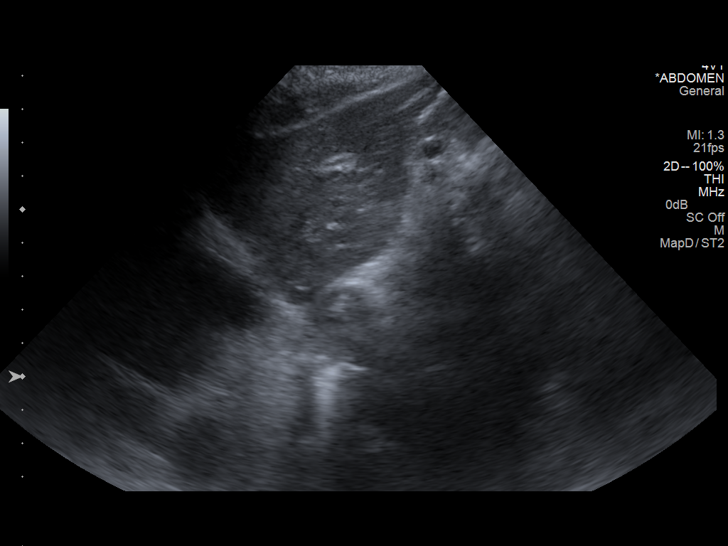
[im 4/47]
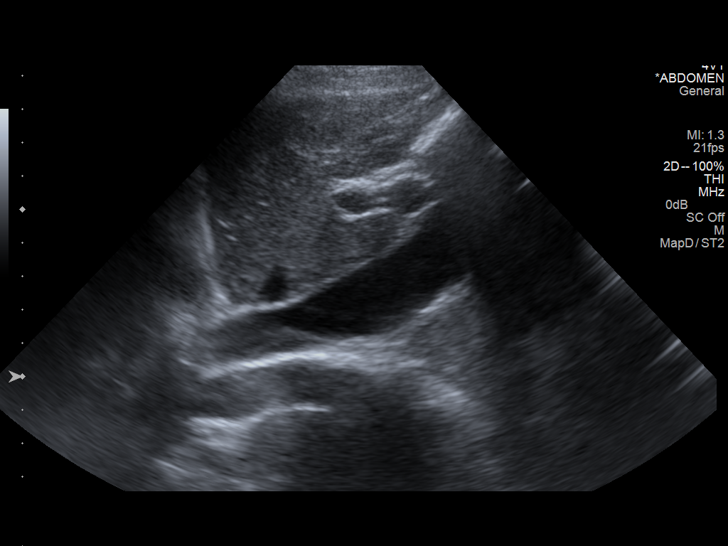
[im 8/47]
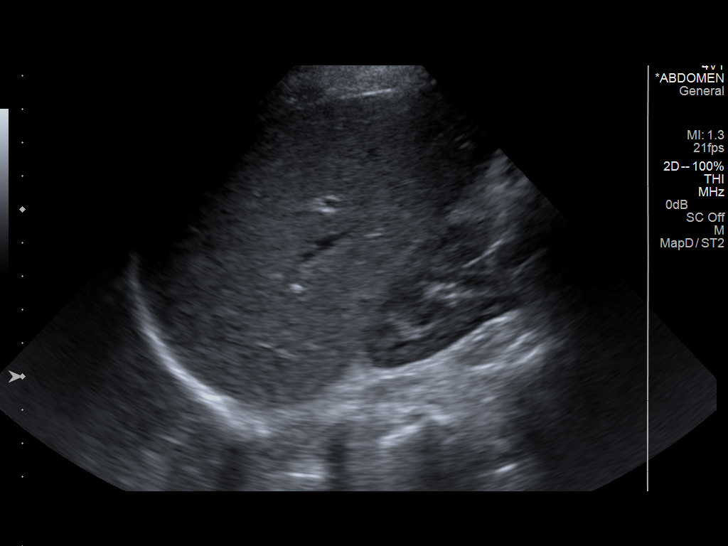
[im 12/47]
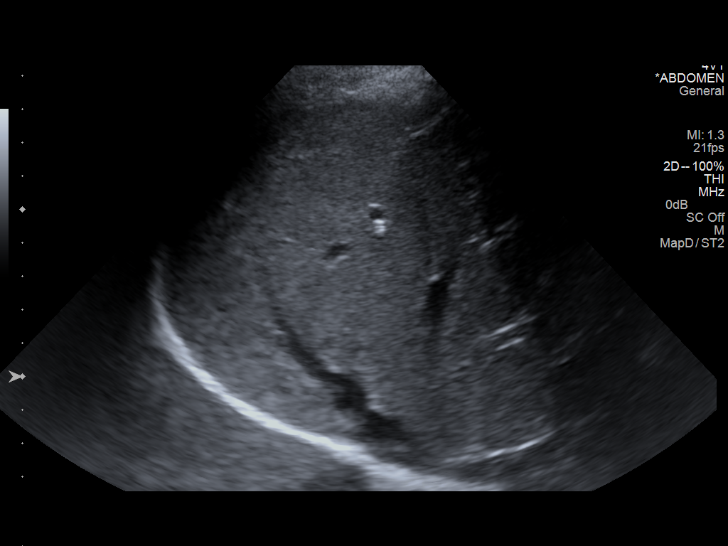
[im 16/47]
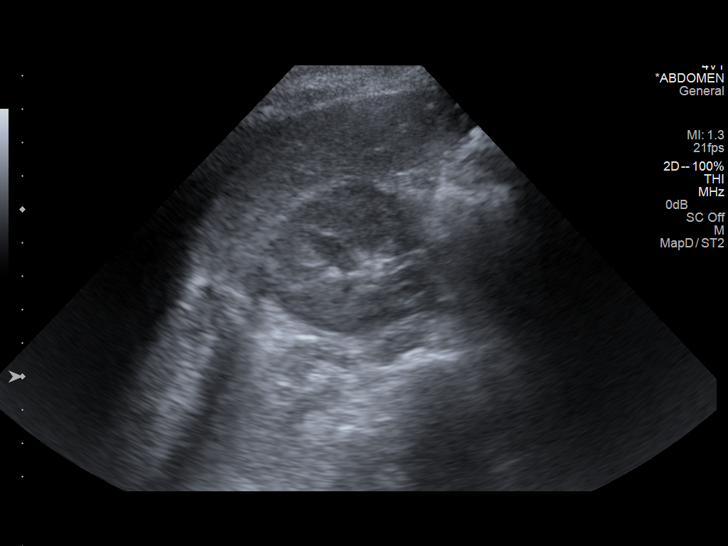
[im 18/47]
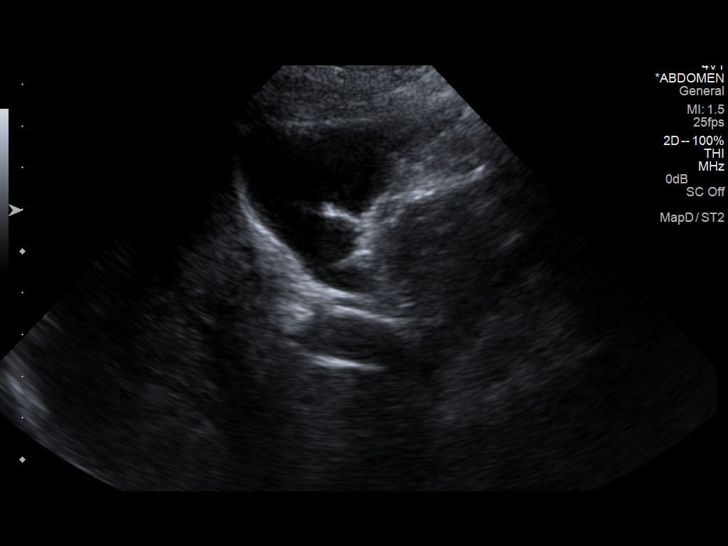
[im 22/47]
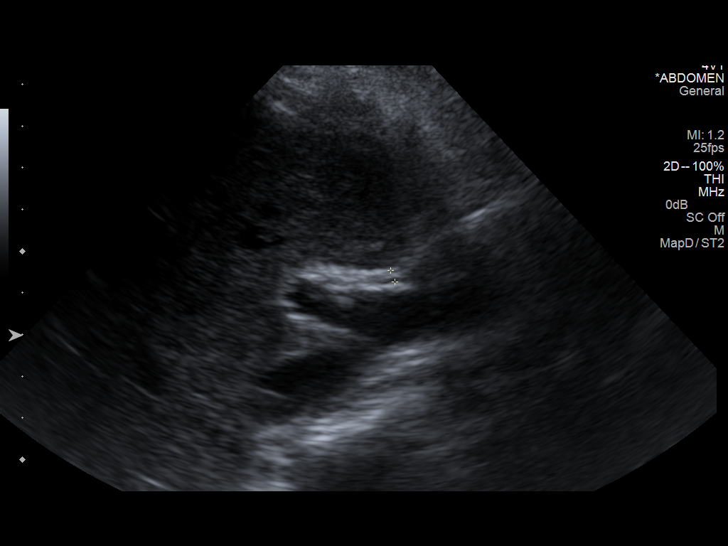
[im 25/47]
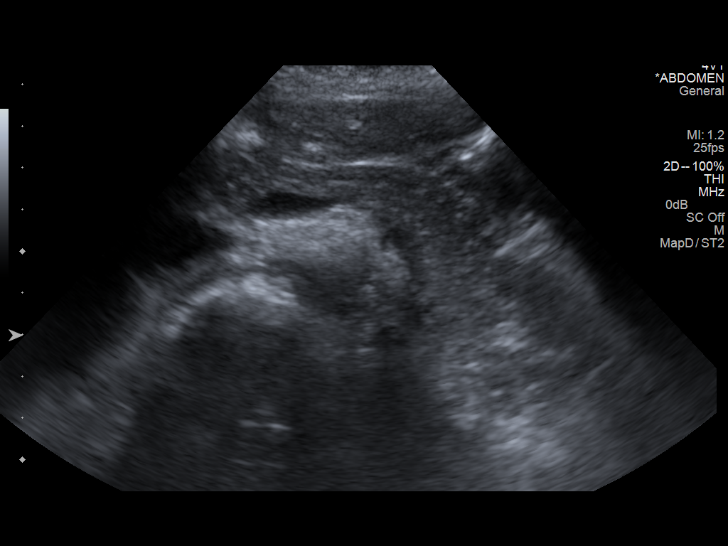
[im 29/47]
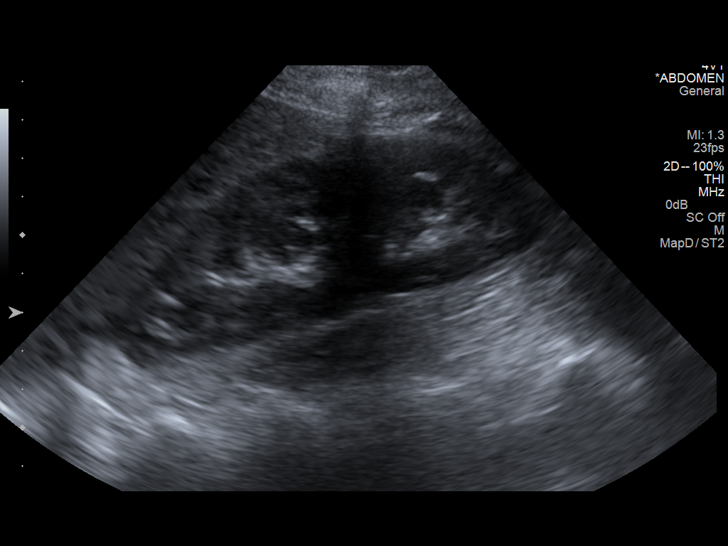
[im 31/47]
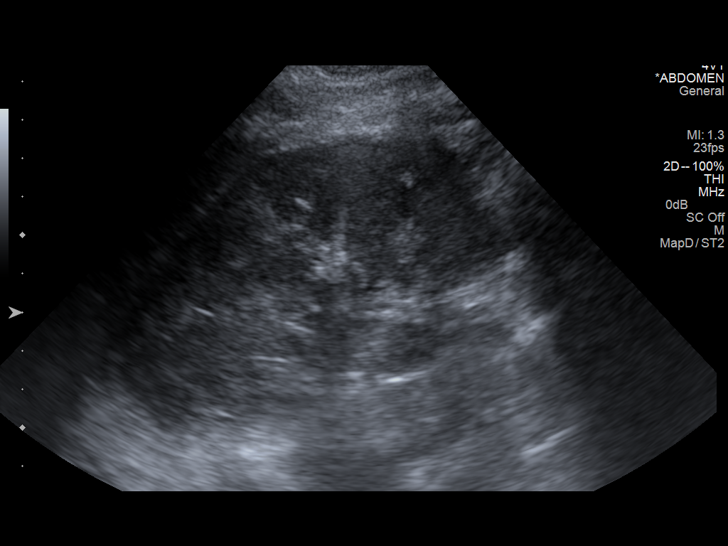
[im 35/47]
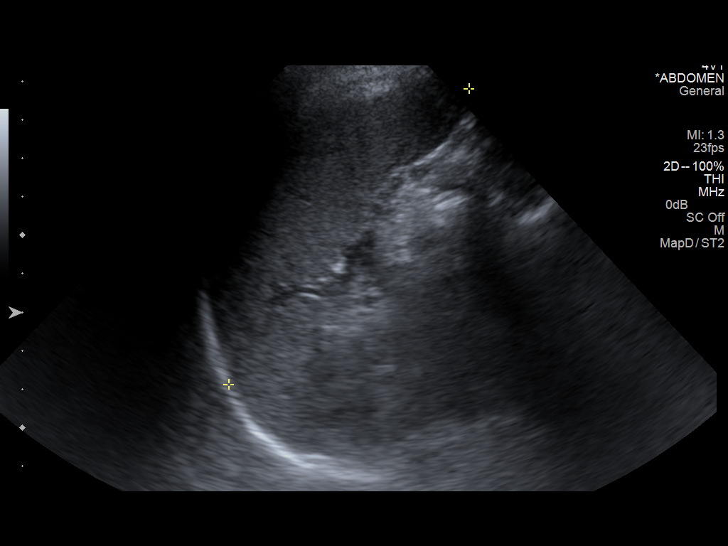
[im 39/47]
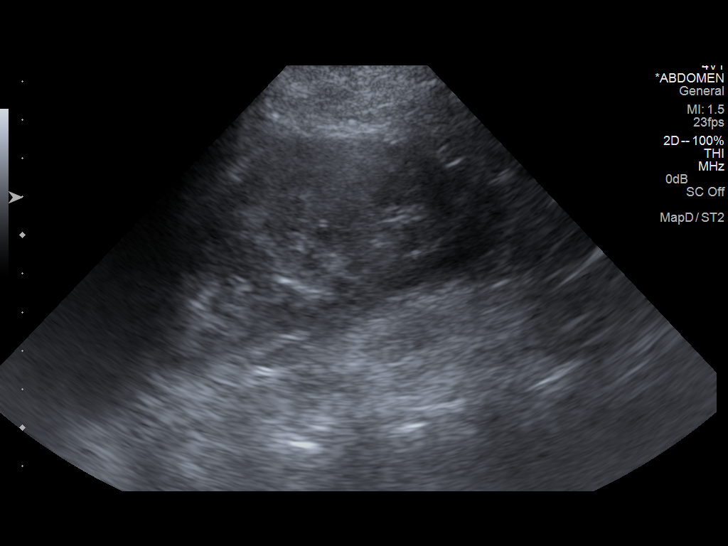
[im 43/47]
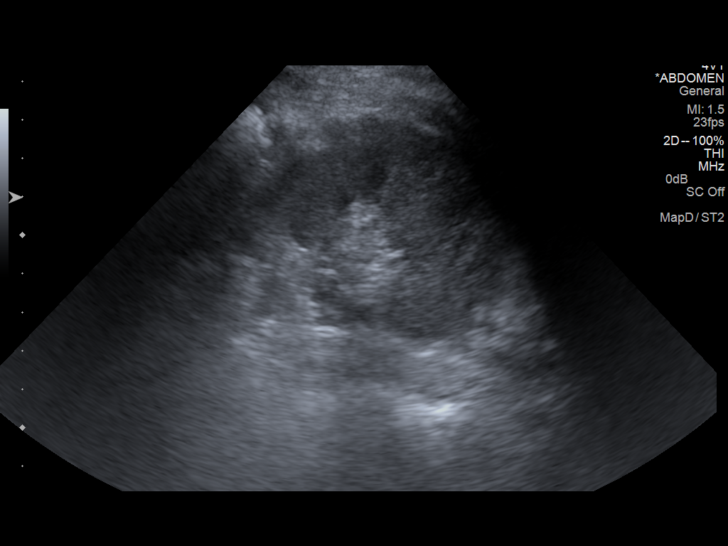
[im 47/47]
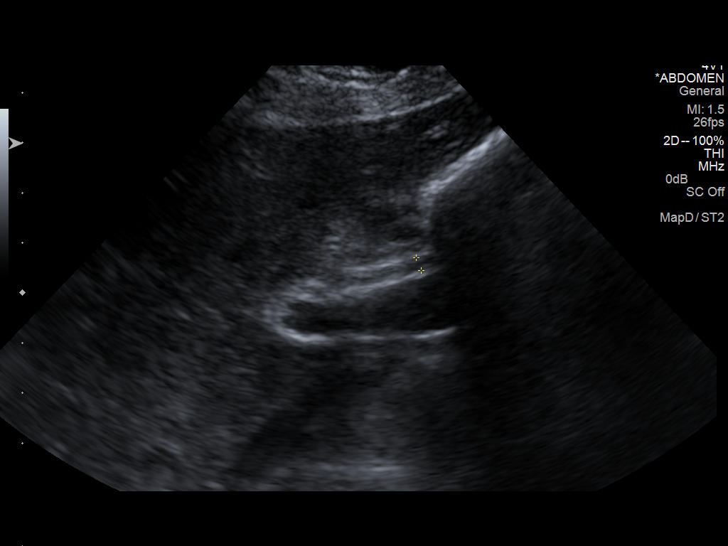

[14 of 25 positions shown; findings below may reference images not displayed]

FINDINGS: Gallbladder

No gallstones or wall thickening visualized. No sonographic Murphy
sign noted.

Common bile duct

Diameter: 2.9 mm.

Liver

No focal lesion identified. Within normal limits in parenchymal
echogenicity.

IVC

No abnormality visualized.

Pancreas

Visualized portion unremarkable.

Spleen

Size and appearance within normal limits.

Right Kidney

Length: 12.2 cm. Echogenicity within normal limits. No mass or
hydronephrosis visualized.

Left Kidney

Length: 12.1 cm. Echogenicity within normal limits. No mass or
hydronephrosis visualized.

Abdominal aorta

No aneurysm visualized.
IMPRESSION: No acute finding by ultrasound. Negative exam.

## 2015-01-14 ENCOUNTER — Other Ambulatory Visit: Payer: Self-pay | Admitting: Physician Assistant

## 2015-01-18 LAB — CYTOLOGY - PAP

## 2016-01-20 ENCOUNTER — Other Ambulatory Visit: Payer: Self-pay | Admitting: Physician Assistant

## 2016-01-20 ENCOUNTER — Other Ambulatory Visit (HOSPITAL_COMMUNITY)
Admission: RE | Admit: 2016-01-20 | Discharge: 2016-01-20 | Disposition: A | Discharge status: Home / Self Care | Payer: BLUE CROSS/BLUE SHIELD | Source: Ambulatory Visit | Attending: Family Medicine | Admitting: Family Medicine

## 2016-01-20 DIAGNOSIS — Z124 Encounter for screening for malignant neoplasm of cervix: Secondary | ICD-10-CM | POA: Diagnosis not present

## 2016-01-24 LAB — CYTOLOGY - PAP

## 2016-10-02 ENCOUNTER — Encounter (INDEPENDENT_AMBULATORY_CARE_PROVIDER_SITE_OTHER): Payer: Self-pay | Admitting: Orthopaedic Surgery

## 2016-10-02 ENCOUNTER — Ambulatory Visit (INDEPENDENT_AMBULATORY_CARE_PROVIDER_SITE_OTHER): Payer: BLUE CROSS/BLUE SHIELD | Admitting: Orthopaedic Surgery

## 2016-10-02 ENCOUNTER — Ambulatory Visit (INDEPENDENT_AMBULATORY_CARE_PROVIDER_SITE_OTHER): Payer: Self-pay

## 2016-10-02 DIAGNOSIS — S62644A Nondisplaced fracture of proximal phalanx of right ring finger, initial encounter for closed fracture: Secondary | ICD-10-CM | POA: Diagnosis not present

## 2016-10-02 NOTE — Progress Notes (Signed)
Office Visit Note   Patient: Becky Leonard           Date of Birth: [birth year]           MRN: [id number] Visit Date: 10/02/2016              Requested by: No referring provider defined for this encounter. PCP: No PCP Per Patient   Assessment & Plan: Visit Diagnoses:  1. Nondisplaced fracture of proximal phalanx of right ring finger, initial encounter for closed fracture     Plan: Buddy tape to the long finger was applied today begin gentle range of motion as tolerated. Follow up in 3 weeks for clinical recheck. X-rays not needed. We'll evaluate the need for hand therapy at that time.  Follow-Up Instructions: Return in about 3 years (around 10/03/2019) for recheck finger.   Orders:  Orders Placed This Encounter  Procedures  . XR Hand Complete Left   No orders of the defined types were placed in this encounter.     Procedures: No procedures performed   Clinical Data: No additional findings.   Subjective: Chief Complaint  Patient presents with  . Left Ring Finger - Pain    Patient is a 30 year old female who fell off a horse yesterday and sustained a right ring finger injury. She is not exactly sure how she landed on the finger. She has severe swelling and bruising and pain with range of motion of the finger but does radiate into the palm. Denies any other injuries.    Review of Systems  Constitutional: Negative.   HENT: Negative.   Eyes: Negative.   Respiratory: Negative.   Cardiovascular: Negative.   Endocrine: Negative.   Musculoskeletal: Negative.   Neurological: Negative.   Hematological: Negative.   Psychiatric/Behavioral: Negative.   All other systems reviewed and are negative.    Objective: Vital Signs: There were no vitals taken for this visit.  Physical Exam  Constitutional: She is oriented to person, place, and time. She appears well-developed and well-nourished.  HENT:  Head: Atraumatic.  Eyes: EOM are normal.  Neck: Neck supple.    Cardiovascular: Intact distal pulses.   Pulmonary/Chest: Effort normal.  Abdominal: Soft.  Neurological: She is alert and oriented to person, place, and time.  Skin: Skin is warm. Capillary refill takes less than 2 seconds.  Psychiatric: She has a normal mood and affect. Her behavior is normal. Judgment and thought content normal.  Nursing note and vitals reviewed.   Ortho Exam Exam of the right hand shows bruising and swelling into the palm. Her flexor and extensor tendons are intact. Neurovascularly intact. She does have some pain with range of motion of the finger. Collateral ligaments are intact. Central slip is intact. Specialty Comments:  No specialty comments available.  Imaging: Xr Hand Complete Left  Result Date: 10/02/2016 Nondisplaced proximal phalanx fracture of the ring finger.    PMFS History: Patient Active Problem List   Diagnosis Date Noted  . Nondisplaced fracture of proximal phalanx of right ring finger, initial encounter for closed fracture 10/02/2016  . Active labor at term 11/28/2012  . SVD (spontaneous vaginal delivery) 11/28/2012   Past Medical History:  Diagnosis Date  . Active labor at term 11/28/2012  . SVD (spontaneous vaginal delivery) 11/28/2012    Family History  Problem Relation Age of Onset  . Hypertension Father   . Diabetes Other     No past surgical history on file. Social History   Occupational History  . Not  on file.   Social History Main Topics  . Smoking status: Never Smoker  . Smokeless tobacco: Never Used  . Alcohol use No  . Drug use: No  . Sexual activity: Yes    Birth control/ protection: None

## 2016-10-27 ENCOUNTER — Ambulatory Visit (INDEPENDENT_AMBULATORY_CARE_PROVIDER_SITE_OTHER): Payer: BLUE CROSS/BLUE SHIELD | Admitting: Orthopaedic Surgery

## 2016-10-27 VITALS — Ht 61.0 in | Wt 119.0 lb

## 2016-10-27 DIAGNOSIS — S62644A Nondisplaced fracture of proximal phalanx of right ring finger, initial encounter for closed fracture: Secondary | ICD-10-CM

## 2016-10-27 NOTE — Progress Notes (Signed)
4 week f/u visit for nondisplaced p1 fx of left ring finger.  Doing well.  No real complaints.  Able to flex ring finger without pain.  DIP joint slightly stiff but due to residual swelling.  Finger is clinically benign.  D/c buddy tape.  Begin ROM exercises.  No heavy lifting for 2 more week then increase as tolerated.  F/u prn.

## 2017-01-22 ENCOUNTER — Other Ambulatory Visit (HOSPITAL_COMMUNITY)
Admission: RE | Admit: 2017-01-22 | Discharge: 2017-01-22 | Disposition: A | Discharge status: Home / Self Care | Payer: BLUE CROSS/BLUE SHIELD | Source: Ambulatory Visit | Attending: Physician Assistant | Admitting: Physician Assistant

## 2017-01-22 ENCOUNTER — Other Ambulatory Visit: Payer: Self-pay | Admitting: Physician Assistant

## 2017-01-22 DIAGNOSIS — Z124 Encounter for screening for malignant neoplasm of cervix: Secondary | ICD-10-CM | POA: Insufficient documentation

## 2017-01-22 DIAGNOSIS — Z0000 Encounter for general adult medical examination without abnormal findings: Secondary | ICD-10-CM | POA: Diagnosis not present

## 2017-01-22 DIAGNOSIS — Z1151 Encounter for screening for human papillomavirus (HPV): Secondary | ICD-10-CM | POA: Diagnosis not present

## 2017-01-25 LAB — CYTOLOGY - PAP
Diagnosis: UNDETERMINED — AB
HPV: DETECTED — AB

## 2017-07-30 ENCOUNTER — Other Ambulatory Visit (HOSPITAL_COMMUNITY)
Admission: RE | Admit: 2017-07-30 | Discharge: 2017-07-30 | Disposition: A | Discharge status: Home / Self Care | Payer: BLUE CROSS/BLUE SHIELD | Source: Ambulatory Visit | Attending: Family Medicine | Admitting: Family Medicine

## 2017-07-30 ENCOUNTER — Other Ambulatory Visit: Payer: Self-pay | Admitting: Physician Assistant

## 2017-07-30 DIAGNOSIS — Z01411 Encounter for gynecological examination (general) (routine) with abnormal findings: Secondary | ICD-10-CM | POA: Insufficient documentation

## 2017-07-30 DIAGNOSIS — Z01419 Encounter for gynecological examination (general) (routine) without abnormal findings: Secondary | ICD-10-CM | POA: Diagnosis not present

## 2017-07-30 DIAGNOSIS — R896 Abnormal cytological findings in specimens from other organs, systems and tissues: Secondary | ICD-10-CM | POA: Diagnosis not present

## 2017-08-02 LAB — CYTOLOGY - PAP: Diagnosis: NEGATIVE

## 2018-02-04 ENCOUNTER — Other Ambulatory Visit: Payer: Self-pay | Admitting: Physician Assistant

## 2018-02-04 ENCOUNTER — Other Ambulatory Visit (HOSPITAL_COMMUNITY)
Admission: RE | Admit: 2018-02-04 | Discharge: 2018-02-04 | Disposition: A | Discharge status: Home / Self Care | Payer: BLUE CROSS/BLUE SHIELD | Source: Ambulatory Visit | Attending: Family Medicine | Admitting: Family Medicine

## 2018-02-04 DIAGNOSIS — Z01419 Encounter for gynecological examination (general) (routine) without abnormal findings: Secondary | ICD-10-CM | POA: Diagnosis not present

## 2018-02-04 DIAGNOSIS — Z0000 Encounter for general adult medical examination without abnormal findings: Secondary | ICD-10-CM | POA: Diagnosis not present

## 2018-02-04 DIAGNOSIS — Z124 Encounter for screening for malignant neoplasm of cervix: Secondary | ICD-10-CM | POA: Diagnosis not present

## 2018-02-04 DIAGNOSIS — Z13220 Encounter for screening for lipoid disorders: Secondary | ICD-10-CM | POA: Diagnosis not present

## 2018-02-05 LAB — CYTOLOGY - PAP: DIAGNOSIS: NEGATIVE

## 2018-03-11 ENCOUNTER — Ambulatory Visit: Payer: BLUE CROSS/BLUE SHIELD | Admitting: Allergy and Immunology

## 2018-04-15 ENCOUNTER — Ambulatory Visit: Payer: BLUE CROSS/BLUE SHIELD | Admitting: Allergy and Immunology

## 2018-04-15 ENCOUNTER — Encounter: Payer: Self-pay | Admitting: Allergy and Immunology

## 2018-04-15 VITALS — BP 118/68 | HR 68 | Temp 98.1°F | Resp 18 | Ht 61.2 in | Wt 128.2 lb

## 2018-04-15 DIAGNOSIS — T7840XD Allergy, unspecified, subsequent encounter: Secondary | ICD-10-CM | POA: Diagnosis not present

## 2018-04-15 DIAGNOSIS — T7800XD Anaphylactic reaction due to unspecified food, subsequent encounter: Secondary | ICD-10-CM | POA: Diagnosis not present

## 2018-04-15 DIAGNOSIS — J310 Chronic rhinitis: Secondary | ICD-10-CM | POA: Insufficient documentation

## 2018-04-15 DIAGNOSIS — T7840XA Allergy, unspecified, initial encounter: Secondary | ICD-10-CM | POA: Insufficient documentation

## 2018-04-15 DIAGNOSIS — L500 Allergic urticaria: Secondary | ICD-10-CM | POA: Diagnosis not present

## 2018-04-15 DIAGNOSIS — T7800XA Anaphylactic reaction due to unspecified food, initial encounter: Secondary | ICD-10-CM | POA: Insufficient documentation

## 2018-04-15 MED ORDER — EPINEPHRINE 0.3 MG/0.3ML IJ SOAJ: 0.3000 mg | Freq: Once | INTRAMUSCULAR | 2 refills | Status: AC

## 2018-04-15 MED ORDER — FLUTICASONE PROPIONATE 50 MCG/ACT NA SUSP: 1.0000 | Freq: Two times a day (BID) | NASAL | 5 refills | Status: DC

## 2018-04-15 NOTE — Assessment & Plan Note (Addendum)
The patient's history suggests food allergy and positive skin test results today confirm this diagnosis.  Meticulous avoidance of cow's milk and egg as discussed.  A prescription has been provided for epinephrine auto-injector 2 pack along with instructions for proper administration.  A food allergy action plan has been provided and discussed.  Medic Alert identification is recommended.  Should symptoms recur, a journal is to be kept recording any foods eaten, beverages consumed, and medications taken within a 6 hour time period prior to the onset of symptoms, as well as record activities being performed, and environmental conditions. For any symptoms concerning for anaphylaxis, epinephrine is to be administered and 911 is to be called immediately.

## 2018-04-15 NOTE — Assessment & Plan Note (Signed)
   A prescription has been provided for fluticasone nasal spray, one spray per nostril 1-2 times daily as needed. Proper nasal spray technique has been discussed and demonstrated.  Nasal saline spray (i.e., Simply Saline) or nasal saline lavage (i.e., NeilMed) is recommended as needed and prior to medicated nasal sprays. 

## 2018-04-15 NOTE — Patient Instructions (Addendum)
Food allergy The patient's history suggests food allergy and positive skin test results today confirm this diagnosis.  Meticulous avoidance of cow's milk and egg as discussed.  A prescription has been provided for epinephrine auto-injector 2 pack along with instructions for proper administration.  A food allergy action plan has been provided and discussed.  Medic Alert identification is recommended.  Should symptoms recur, a journal is to be kept recording any foods eaten, beverages consumed, and medications taken within a 6 hour time period prior to the onset of symptoms, as well as record activities being performed, and environmental conditions. For any symptoms concerning for anaphylaxis, epinephrine is to be administered and 911 is to be called immediately.  Rhinitis  A prescription has been provided for fluticasone nasal spray, one spray per nostril 1-2 times daily as needed. Proper nasal spray technique has been discussed and demonstrated.  Nasal saline spray (i.e., Simply Saline) or nasal saline lavage (i.e., NeilMed) is recommended as needed and prior to medicated nasal sprays.   Return in about 1 year (around 04/16/2019), or if symptoms worsen or fail to improve.

## 2018-04-15 NOTE — Progress Notes (Signed)
New Patient Note  RE: Becky Leonard MRN: [id number] DOB: [birth year] Date of Office Visit: 04/15/2018  Referring provider: No ref. provider found Primary care provider: Milus Height, PA-C  Chief Complaint: Allergic Reaction; Urticaria; and Allergic Rhinitis    History of present illness: Becky Leonard is a 32 y.o. female presenting today for evaluation of possible food allergy. She is concerned that she may be allergic to cows milk because approximately 4 to 6 weeks ago she consumed a sandwich with cheese, ham, bacon, lettuce, tomato, as well as a [employer] peanut butter cup and "started itching and breaking out" and hives.  The next day, she consumed a protein bar with milk, chocolate, and peanut butter and again developed a urticaria.  A few days later, she consumed queso and a quesadilla at a [employer] and reports that she developed generalized urticaria and states that her leg became itchy, red, and felt like it was "on fire."  In addition, she has consumed a pound cake made with milk and egg and developed hives.  She has not experienced concomitant cardiopulmonary or GI symptoms.  However, she reports that when she consumes eggs she feels nauseated, though she does not develop generalized urticaria. She experiences occasional nasal congestion, rhinorrhea, nasal pruritus, and ocular pruritus.  She attempts to control the symptoms with cetirizine.  Assessment and plan: Food allergy The patient's history suggests food allergy and positive skin test results today confirm this diagnosis.  Meticulous avoidance of cow's milk and egg as discussed.  A prescription has been provided for epinephrine auto-injector 2 pack along with instructions for proper administration.  A food allergy action plan has been provided and discussed.  Medic Alert identification is recommended.  Should symptoms recur, a journal is to be kept recording any foods eaten, beverages consumed, and medications  taken within a 6 hour time period prior to the onset of symptoms, as well as record activities being performed, and environmental conditions. For any symptoms concerning for anaphylaxis, epinephrine is to be administered and 911 is to be called immediately.  Rhinitis  A prescription has been provided for fluticasone nasal spray, one spray per nostril 1-2 times daily as needed. Proper nasal spray technique has been discussed and demonstrated.  Nasal saline spray (i.e., Simply Saline) or nasal saline lavage (i.e., NeilMed) is recommended as needed and prior to medicated nasal sprays.   Meds ordered this encounter  Medications  . EPINEPHrine (AUVI-Q) 0.3 mg/0.3 mL IJ SOAJ injection    Sig: Inject 0.3 mLs (0.3 mg total) into the muscle once for 1 dose. As directed for life-threatening allergic reactions    Dispense:  4 Device    Refill:  2  . fluticasone (FLONASE) 50 MCG/ACT nasal spray    Sig: Place 1 spray into both nostrils 2 (two) times daily.    Dispense:  16 g    Refill:  5    Diagnostics: Environmental skin testing: Negative despite a positive histamine control. Food allergen skin testing: Positive to cows milk and egg white.    Physical examination: Blood pressure 118/68, pulse 68, temperature 98.1 F (36.7 C), temperature source Oral, resp. rate 18, height 5' 1.2" (1.554 m), weight 128 lb 3.2 oz (58.2 kg), SpO2 97 %.  General: Alert, interactive, in no acute distress. HEENT: TMs pearly gray, turbinates moderately edematous without discharge, post-pharynx unremarkable. Neck: Supple without lymphadenopathy. Lungs: Clear to auscultation without wheezing, rhonchi or rales. CV: Normal S1, S2 without murmurs. Abdomen: Nondistended, nontender. Skin: Warm  and dry, without lesions or rashes. Extremities:  No clubbing, cyanosis or edema. Neuro:   Grossly intact.  Review of systems:  Review of systems negative except as noted in HPI / PMHx or noted below: Review of Systems    Constitutional: Negative.   HENT: Negative.   Eyes: Negative.   Respiratory: Negative.   Cardiovascular: Negative.   Gastrointestinal: Negative.   Genitourinary: Negative.   Musculoskeletal: Negative.   Skin: Negative.   Neurological: Negative.   Endo/Heme/Allergies: Negative.   Psychiatric/Behavioral: Negative.     Past medical history:  Past Medical History:  Diagnosis Date  . Active labor at term 11/28/2012  . SVD (spontaneous vaginal delivery) 11/28/2012  . Urticaria     Past surgical history:  History reviewed. No pertinent surgical history.  Family history: Family History  Problem Relation Age of Onset  . Hypertension Father   . Diabetes Other   . Eczema Neg Hx   . Urticaria Neg Hx   . Allergic rhinitis Neg Hx   . Asthma Neg Hx     Social history: Social History   Socioeconomic History  . Marital status: Married    Spouse name: Not on file  . Number of children: Not on file  . Years of education: Not on file  . Highest education level: Not on file  Occupational History  . Not on file  Social Needs  . Financial resource strain: Not on file  . Food insecurity:    Worry: Not on file    Inability: Not on file  . Transportation needs:    Medical: Not on file    Non-medical: Not on file  Tobacco Use  . Smoking status: Never Smoker  . Smokeless tobacco: Never Used  Substance and Sexual Activity  . Alcohol use: No  . Drug use: No  . Sexual activity: Yes    Birth control/protection: None  Lifestyle  . Physical activity:    Days per week: Not on file    Minutes per session: Not on file  . Stress: Not on file  Relationships  . Social connections:    Talks on phone: Not on file    Gets together: Not on file    Attends religious service: Not on file    Active member of club or organization: Not on file    Attends meetings of clubs or organizations: Not on file    Relationship status: Not on file  . Intimate partner violence:    Fear of current or  ex partner: Not on file    Emotionally abused: Not on file    Physically abused: Not on file    Forced sexual activity: Not on file  Other Topics Concern  . Not on file  Social History Narrative  . Not on file   Environmental History: The patient lives in a 31 year old house with vital floors throughout, gas heat, and central air.  There are dogs in the home which have access to her bedroom.  There is no known mold/water damage in the home.  She is a non-smoker.   Allergies as of 04/15/2018   No Known Allergies     Medication List        Accurate as of 04/15/18  1:20 PM. Always use your most recent med list.          EPINEPHrine 0.3 mg/0.3 mL Soaj injection Commonly known as:  AUVI-Q Inject 0.3 mLs (0.3 mg total) into the muscle once for 1 dose. As directed  for life-threatening allergic reactions   fluticasone 50 MCG/ACT nasal spray Commonly known as:  FLONASE Place 1 spray into both nostrils 2 (two) times daily.   TRI-LO-SPRINTEC PO Take by mouth.       Known medication allergies: No Known Allergies  I appreciate the opportunity to take part in Ameliyah's care. Please do not hesitate to contact me with questions.  Sincerely,   R. Jorene Guest, MD

## 2018-06-24 ENCOUNTER — Ambulatory Visit: Payer: 59 | Admitting: Allergy and Immunology

## 2018-06-24 ENCOUNTER — Encounter: Payer: Self-pay | Admitting: Allergy and Immunology

## 2018-06-24 VITALS — BP 116/74 | HR 63 | Resp 16 | Ht 61.0 in | Wt 130.8 lb

## 2018-06-24 DIAGNOSIS — T7840XD Allergy, unspecified, subsequent encounter: Secondary | ICD-10-CM | POA: Diagnosis not present

## 2018-06-24 DIAGNOSIS — J310 Chronic rhinitis: Secondary | ICD-10-CM

## 2018-06-24 DIAGNOSIS — T7800XD Anaphylactic reaction due to unspecified food, subsequent encounter: Secondary | ICD-10-CM

## 2018-06-24 NOTE — Progress Notes (Signed)
    Follow-up Note  RE: Becky Leonard MRN: [id number] DOB: 07-02-1986 Date of Office Visit: 06/24/2018  Primary care provider: Milus Heightedmon, Noelle, PA-C Referring provider: Milus Heightedmon, Noelle, PA-C  History of present illness: Becky Munromanda L Dubow is a 32 y.o. female with food allergy and chronic rhinitis presenting today for follow-up.  She was previously seen in this clinic on April 15, 2018 for her initial evaluation.  During that visit skin tests confirmed food allergy to egg and milk.  She reports that when consuming chicken she "immediately" feels abdominal discomfort and has diarrhea.  She has also noticed mild lip tingling with the consumption of chicken.  She continues to carefully avoid milk and egg and has access to epinephrine autoinjectors.  She has no nasal symptom complaints today.  Assessment and plan: Food allergy The patient's history suggest the possibility of food allergy to chicken and orange.  Food allergen skin testing today was uninterpretable due to a nonreactive histamine control.  Laboratory order form has been provided for serum specific IgE against chicken meat and orange.  Until this food allergies have been definitively ruled out, she will avoid chicken and oranges.  Continue meticulous avoidance of egg and cow's milk and have access to epinephrine autoinjector 2 pack in case of accidental ingestion.  Food allergy action plan is in place.   Diagnostics: Food allergen skin testing: Negative, however the histamine control was nonreactive.  Therefore, these skin test results were uninterpretable.    Physical examination: Blood pressure 116/74, pulse 63, resp. rate 16, height 5\' 1"  (1.549 m), weight 130 lb 12.8 oz (59.3 kg), SpO2 99 %.  General: Alert, interactive, in no acute distress. Neck: Supple without lymphadenopathy. Lungs: Clear to auscultation without wheezing, rhonchi or rales. CV: Normal S1, S2 without murmurs. Skin: Warm and dry, without lesions or  rashes.  The following portions of the patient's history were reviewed and updated as appropriate: allergies, current medications, past family history, past medical history, past social history, past surgical history and problem list.  Allergies as of 06/24/2018   No Known Allergies     Medication List        Accurate as of 06/24/18  9:38 PM. Always use your most recent med list.          EPINEPHrine 0.3 mg/0.3 mL Soaj injection Commonly known as:  EPI-PEN Inject 0.3 mg into the muscle once.   fluticasone 50 MCG/ACT nasal spray Commonly known as:  FLONASE Place 1 spray into both nostrils 2 (two) times daily.   TRI-LO-SPRINTEC PO Take by mouth.       No Known Allergies  I appreciate the opportunity to take part in Rosabella's care. Please do not hesitate to contact me with questions.  Sincerely,   R. Jorene Guestarter Ngina Royer, MD

## 2018-06-24 NOTE — Assessment & Plan Note (Signed)
The patient's history suggest the possibility of food allergy to chicken and orange.  Food allergen skin testing today was uninterpretable due to a nonreactive histamine control.  Laboratory order form has been provided for serum specific IgE against chicken meat and orange.  Until this food allergies have been definitively ruled out, she will avoid chicken and oranges.  Continue meticulous avoidance of egg and cow's milk and have access to epinephrine autoinjector 2 pack in case of accidental ingestion.  Food allergy action plan is in place.

## 2018-06-24 NOTE — Patient Instructions (Addendum)
Food allergy The patient's history suggest the possibility of food allergy to chicken and orange.  Food allergen skin testing today was uninterpretable due to a nonreactive histamine control.  Laboratory order form has been provided for serum specific IgE against chicken meat and orange.  Until this food allergies have been definitively ruled out, she will avoid chicken and oranges.  Continue meticulous avoidance of egg and cow's milk and have access to epinephrine autoinjector 2 pack in case of accidental ingestion.  Food allergy action plan is in place.   When lab results have returned the patient will be called with further recommendations and follow up instructions.

## 2018-06-26 LAB — ALLERGEN, ORANGE F33: Orange: <0.1 kU/L

## 2018-06-26 LAB — ALLERGEN, CHICKEN F83: Chicken IgE: <0.1 kU/L

## 2019-04-22 DIAGNOSIS — Z0000 Encounter for general adult medical examination without abnormal findings: Secondary | ICD-10-CM | POA: Diagnosis not present

## 2019-04-24 DIAGNOSIS — Z13220 Encounter for screening for lipoid disorders: Secondary | ICD-10-CM | POA: Diagnosis not present

## 2019-04-24 DIAGNOSIS — Z8349 Family history of other endocrine, nutritional and metabolic diseases: Secondary | ICD-10-CM | POA: Diagnosis not present

## 2019-04-24 DIAGNOSIS — Z0000 Encounter for general adult medical examination without abnormal findings: Secondary | ICD-10-CM | POA: Diagnosis not present

## 2019-12-24 DIAGNOSIS — L259 Unspecified contact dermatitis, unspecified cause: Secondary | ICD-10-CM | POA: Diagnosis not present

## 2020-03-24 ENCOUNTER — Telehealth: Payer: Self-pay

## 2020-03-24 NOTE — Telephone Encounter (Signed)
I spoke with the pt on the phone explaining the CDC algorithm

## 2020-03-24 NOTE — Telephone Encounter (Signed)
Good afternoon,   My work is trying to make the COVID 19 vaccine mandatory. I have my concerns about it especially with my food allergies. I already refuse the flu shot every year. 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 I concerned about adverse reactions from the vaccine. I have a follow up appointment with you in August. I was wondering if you would be willing to write me a letter exempting me from getting the COVID vaccines.   Thank you,  Becky Leonard    Please advise to witting note or not!

## 2020-04-07 DIAGNOSIS — E538 Deficiency of other specified B group vitamins: Secondary | ICD-10-CM | POA: Diagnosis not present

## 2020-04-07 DIAGNOSIS — Z0001 Encounter for general adult medical examination with abnormal findings: Secondary | ICD-10-CM | POA: Diagnosis not present

## 2020-04-07 DIAGNOSIS — E612 Magnesium deficiency: Secondary | ICD-10-CM | POA: Diagnosis not present

## 2020-04-07 DIAGNOSIS — Z889 Allergy status to unspecified drugs, medicaments and biological substances status: Secondary | ICD-10-CM | POA: Diagnosis not present

## 2020-04-07 DIAGNOSIS — E559 Vitamin D deficiency, unspecified: Secondary | ICD-10-CM | POA: Diagnosis not present

## 2020-04-07 DIAGNOSIS — L299 Pruritus, unspecified: Secondary | ICD-10-CM | POA: Diagnosis not present

## 2020-04-07 DIAGNOSIS — Z1329 Encounter for screening for other suspected endocrine disorder: Secondary | ICD-10-CM | POA: Diagnosis not present

## 2020-04-07 DIAGNOSIS — M79641 Pain in right hand: Secondary | ICD-10-CM | POA: Diagnosis not present

## 2020-05-05 DIAGNOSIS — Z0000 Encounter for general adult medical examination without abnormal findings: Secondary | ICD-10-CM | POA: Diagnosis not present

## 2020-05-12 DIAGNOSIS — E612 Magnesium deficiency: Secondary | ICD-10-CM | POA: Diagnosis not present

## 2020-05-12 DIAGNOSIS — L299 Pruritus, unspecified: Secondary | ICD-10-CM | POA: Diagnosis not present

## 2020-05-12 DIAGNOSIS — E559 Vitamin D deficiency, unspecified: Secondary | ICD-10-CM | POA: Diagnosis not present

## 2020-05-12 DIAGNOSIS — Z1329 Encounter for screening for other suspected endocrine disorder: Secondary | ICD-10-CM | POA: Diagnosis not present

## 2020-06-24 ENCOUNTER — Encounter: Payer: Self-pay | Admitting: Allergy and Immunology

## 2020-06-24 ENCOUNTER — Ambulatory Visit (INDEPENDENT_AMBULATORY_CARE_PROVIDER_SITE_OTHER): Payer: BC Managed Care – PPO | Admitting: Allergy and Immunology

## 2020-06-24 ENCOUNTER — Other Ambulatory Visit: Payer: Self-pay

## 2020-06-24 DIAGNOSIS — T7800XA Anaphylactic reaction due to unspecified food, initial encounter: Secondary | ICD-10-CM

## 2020-06-24 DIAGNOSIS — L237 Allergic contact dermatitis due to plants, except food: Secondary | ICD-10-CM

## 2020-06-24 DIAGNOSIS — J310 Chronic rhinitis: Secondary | ICD-10-CM | POA: Diagnosis not present

## 2020-06-24 MED ORDER — EPINEPHRINE 0.3 MG/0.3ML IJ SOAJ: 0.3000 mg | INTRAMUSCULAR | 1 refills | Status: AC | PRN

## 2020-06-24 MED ORDER — FLUTICASONE PROPIONATE 50 MCG/ACT NA SUSP: 1.0000 | Freq: Two times a day (BID) | NASAL | 5 refills | Status: AC | PRN

## 2020-06-24 NOTE — Progress Notes (Signed)
Follow-up Note  RE: Becky Leonard MRN: [id number] DOB: Mar 21, 1986 Date of Office Visit: 06/24/2020  Primary care provider: Milus Height, PA Referring provider: Milus Height, PA  History of present illness: Becky Leonard is a 34 y.o. female with food allergy and chronic rhinitis presenting today for follow-up.  She was last seen in this clinic in August 2019.  She has still been having various symptoms with multiple foods she had IgG testing at an integrative medicine office and was found to be positive to beef, soy, cows milk, egg, muscle, oyster, buckwheat, bran, oat, Brewers yeast, eggplant, peanut, almond, Chia, cola, sesame, black pepper, cinnamon, nut meg, oregano, peppermint, [employer], vanilla bean, coffee, kelp, and radish.  She experiences symptoms with many of these foods and has attempted to keep a journal to keep track of symptoms and correlating food triggers.  She notes that she is currently able to consume chicken and [place].  During her previous visit she had multiple symptoms when consuming chicken.   She has no nasal allergy symptom complaints today.   She was recently working in the garden and now has poison ivy on her forearms which calamine lotion "has not touched."  Assessment and plan:  Food allergy  Continue avoidance of all foods which cause untoward symptoms and have access to epinephrine autoinjector 2 pack in case of accidental ingestion.  A refill prescription has been provided for epinephrine 0.3 mg autoinjector (AuviQ) 2 pack along with instructions for its proper administration.  Rhinitis  Fluticasone (Flonase) nasal spray if needed.  Nasal saline spray (i.e. Simply Saline) is recommended prior to medicated nasal sprays and as needed.  Contact dermatitis due to poison ivy  Prednisone has been provided, 40 mg x3 days, 20 mg x1 day, 10 mg x1 day, then stop.  Use calamine lotion as needed.   Meds ordered this encounter  Medications  .  EPINEPHrine (AUVI-Q) 0.3 mg/0.3 mL IJ SOAJ injection    Sig: Inject 0.3 mLs (0.3 mg total) into the muscle as needed for anaphylaxis.    Dispense:  2 each    Refill:  1  . fluticasone (FLONASE) 50 MCG/ACT nasal spray    Sig: Place 1 spray into both nostrils 2 (two) times daily as needed.    Dispense:  16 g    Refill:  5    Physical examination: Blood pressure 110/76, pulse 68, temperature 98.4 F (36.9 C), temperature source Oral, resp. rate 16, height 5\' 2"  (1.575 m), weight 134 lb (60.8 kg), SpO2 98 %.  General: Alert, interactive, in no acute distress. HEENT: TMs pearly gray, turbinates mildly edematous without discharge, post-pharynx mildly erythematous. Neck: Supple without lymphadenopathy. Lungs: Clear to auscultation without wheezing, rhonchi or rales. CV: Normal S1, S2 without murmurs. Skin: Erythematous patches with some clustered vesicles and excoriated areas on the forearms bilaterally, left greater than right.  The following portions of the patient's history were reviewed and updated as appropriate: allergies, current medications, past family history, past medical history, past social history, past surgical history and problem list.  Current Outpatient Medications  Medication Sig Dispense Refill  . EPINEPHrine 0.3 mg/0.3 mL IJ SOAJ injection Inject 0.3 mg into the muscle once.    Triphasic (TRI-LO-SPRINTEC PO) Take by mouth.    . EPINEPHrine (AUVI-Q) 0.3 mg/0.3 mL IJ SOAJ injection Inject 0.3 mLs (0.3 mg total) into the muscle as needed for anaphylaxis. 2 each 1  . fluticasone (FLONASE) 50 MCG/ACT nasal spray Place 1  spray into both nostrils 2 (two) times daily as needed. 16 g 5   No current facility-administered medications for this visit.    Allergies  Allergen Reactions  . Beef-Derived Products   . Egg [Eggs Or Egg-Derived Products]   . Lambs Quarters   . Milk-Related Compounds   . Pork-Derived Products     I appreciate the opportunity  to take part in Becky Leonard's care. Please do not hesitate to contact me with questions.  Sincerely,   R. Jorene Guest, MD

## 2020-06-24 NOTE — Assessment & Plan Note (Signed)
   Fluticasone (Flonase) nasal spray if needed.  Nasal saline spray (i.e. Simply Saline) is recommended prior to medicated nasal sprays and as needed.

## 2020-06-24 NOTE — Assessment & Plan Note (Signed)
   Continue avoidance of all foods which cause untoward symptoms and have access to epinephrine autoinjector 2 pack in case of accidental ingestion.  A refill prescription has been provided for epinephrine 0.3 mg autoinjector (AuviQ) 2 pack along with instructions for its proper administration.

## 2020-06-24 NOTE — Assessment & Plan Note (Signed)
   Prednisone has been provided, 40 mg x3 days, 20 mg x1 day, 10 mg x1 day, then stop.  Use calamine lotion as needed.

## 2020-06-24 NOTE — Patient Instructions (Addendum)
Food allergy  Continue avoidance of all foods which cause untoward symptoms and have access to epinephrine autoinjector 2 pack in case of accidental ingestion.  A refill prescription has been provided for epinephrine 0.3 mg autoinjector (AuviQ) 2 pack along with instructions for its proper administration.  Rhinitis  Fluticasone (Flonase) nasal spray if needed.  Nasal saline spray (i.e. Simply Saline) is recommended prior to medicated nasal sprays and as needed.  Contact dermatitis due to poison ivy  Prednisone has been provided, 40 mg x3 days, 20 mg x1 day, 10 mg x1 day, then stop.  Use calamine lotion as needed.   Return if symptoms worsen or fail to improve.

## 2021-01-03 DIAGNOSIS — M50122 Cervical disc disorder at C5-C6 level with radiculopathy: Secondary | ICD-10-CM | POA: Diagnosis not present

## 2021-01-03 DIAGNOSIS — M62830 Muscle spasm of back: Secondary | ICD-10-CM | POA: Diagnosis not present

## 2021-01-03 DIAGNOSIS — M5414 Radiculopathy, thoracic region: Secondary | ICD-10-CM | POA: Diagnosis not present

## 2021-01-03 DIAGNOSIS — M545 Low back pain, unspecified: Secondary | ICD-10-CM | POA: Diagnosis not present

## 2021-01-04 DIAGNOSIS — M50122 Cervical disc disorder at C5-C6 level with radiculopathy: Secondary | ICD-10-CM | POA: Diagnosis not present

## 2021-01-04 DIAGNOSIS — M545 Low back pain, unspecified: Secondary | ICD-10-CM | POA: Diagnosis not present

## 2021-01-04 DIAGNOSIS — M5414 Radiculopathy, thoracic region: Secondary | ICD-10-CM | POA: Diagnosis not present

## 2021-01-04 DIAGNOSIS — M62830 Muscle spasm of back: Secondary | ICD-10-CM | POA: Diagnosis not present

## 2021-01-06 DIAGNOSIS — M545 Low back pain, unspecified: Secondary | ICD-10-CM | POA: Diagnosis not present

## 2021-01-06 DIAGNOSIS — M50122 Cervical disc disorder at C5-C6 level with radiculopathy: Secondary | ICD-10-CM | POA: Diagnosis not present

## 2021-01-06 DIAGNOSIS — M5414 Radiculopathy, thoracic region: Secondary | ICD-10-CM | POA: Diagnosis not present

## 2021-01-06 DIAGNOSIS — M62830 Muscle spasm of back: Secondary | ICD-10-CM | POA: Diagnosis not present

## 2021-01-11 DIAGNOSIS — M545 Low back pain, unspecified: Secondary | ICD-10-CM | POA: Diagnosis not present

## 2021-01-11 DIAGNOSIS — M5414 Radiculopathy, thoracic region: Secondary | ICD-10-CM | POA: Diagnosis not present

## 2021-01-11 DIAGNOSIS — M62830 Muscle spasm of back: Secondary | ICD-10-CM | POA: Diagnosis not present

## 2021-01-11 DIAGNOSIS — M50122 Cervical disc disorder at C5-C6 level with radiculopathy: Secondary | ICD-10-CM | POA: Diagnosis not present

## 2021-01-13 DIAGNOSIS — M50122 Cervical disc disorder at C5-C6 level with radiculopathy: Secondary | ICD-10-CM | POA: Diagnosis not present

## 2021-01-13 DIAGNOSIS — M62830 Muscle spasm of back: Secondary | ICD-10-CM | POA: Diagnosis not present

## 2021-01-13 DIAGNOSIS — M5414 Radiculopathy, thoracic region: Secondary | ICD-10-CM | POA: Diagnosis not present

## 2021-01-13 DIAGNOSIS — M545 Low back pain, unspecified: Secondary | ICD-10-CM | POA: Diagnosis not present

## 2021-01-14 DIAGNOSIS — M545 Low back pain, unspecified: Secondary | ICD-10-CM | POA: Diagnosis not present

## 2021-01-14 DIAGNOSIS — M62830 Muscle spasm of back: Secondary | ICD-10-CM | POA: Diagnosis not present

## 2021-01-14 DIAGNOSIS — M5414 Radiculopathy, thoracic region: Secondary | ICD-10-CM | POA: Diagnosis not present

## 2021-01-14 DIAGNOSIS — M50122 Cervical disc disorder at C5-C6 level with radiculopathy: Secondary | ICD-10-CM | POA: Diagnosis not present

## 2021-06-21 DIAGNOSIS — Z131 Encounter for screening for diabetes mellitus: Secondary | ICD-10-CM | POA: Diagnosis not present

## 2021-06-21 DIAGNOSIS — Z124 Encounter for screening for malignant neoplasm of cervix: Secondary | ICD-10-CM | POA: Diagnosis not present

## 2021-06-21 DIAGNOSIS — Z23 Encounter for immunization: Secondary | ICD-10-CM | POA: Diagnosis not present

## 2021-06-21 DIAGNOSIS — Z13220 Encounter for screening for lipoid disorders: Secondary | ICD-10-CM | POA: Diagnosis not present

## 2021-06-21 DIAGNOSIS — Z0000 Encounter for general adult medical examination without abnormal findings: Secondary | ICD-10-CM | POA: Diagnosis not present

## 2021-06-21 DIAGNOSIS — Z8249 Family history of ischemic heart disease and other diseases of the circulatory system: Secondary | ICD-10-CM | POA: Diagnosis not present

## 2022-06-22 DIAGNOSIS — Z13220 Encounter for screening for lipoid disorders: Secondary | ICD-10-CM | POA: Diagnosis not present

## 2022-06-22 DIAGNOSIS — Z0000 Encounter for general adult medical examination without abnormal findings: Secondary | ICD-10-CM | POA: Diagnosis not present

## 2023-06-26 DIAGNOSIS — Z131 Encounter for screening for diabetes mellitus: Secondary | ICD-10-CM | POA: Diagnosis not present

## 2023-06-26 DIAGNOSIS — Z0000 Encounter for general adult medical examination without abnormal findings: Secondary | ICD-10-CM | POA: Diagnosis not present

## 2023-09-25 DIAGNOSIS — H5201 Hypermetropia, right eye: Secondary | ICD-10-CM | POA: Diagnosis not present

## 2023-10-25 DIAGNOSIS — S61201A Unspecified open wound of left index finger without damage to nail, initial encounter: Secondary | ICD-10-CM | POA: Diagnosis not present

## 2024-06-26 DIAGNOSIS — Z124 Encounter for screening for malignant neoplasm of cervix: Secondary | ICD-10-CM | POA: Diagnosis not present

## 2024-06-26 DIAGNOSIS — Z0000 Encounter for general adult medical examination without abnormal findings: Secondary | ICD-10-CM | POA: Diagnosis not present

## 2024-09-22 DIAGNOSIS — N390 Urinary tract infection, site not specified: Secondary | ICD-10-CM | POA: Diagnosis not present
